# Patient Record
Sex: Female | Born: 1935 | Hispanic: Yes | Marital: Married | Smoking: Never smoker
Health system: Southern US, Community
[De-identification: ages and names within clinical notes are randomized; demographics above are authoritative.]

## PROBLEM LIST (undated history)

## (undated) HISTORY — PX: ABDOMINAL HYSTERECTOMY: SHX81

## (undated) HISTORY — PX: APPENDECTOMY: SHX54

---

## 2016-05-27 ENCOUNTER — Ambulatory Visit (INDEPENDENT_AMBULATORY_CARE_PROVIDER_SITE_OTHER): Payer: Self-pay | Admitting: Emergency Medicine

## 2016-05-27 ENCOUNTER — Ambulatory Visit (INDEPENDENT_AMBULATORY_CARE_PROVIDER_SITE_OTHER): Payer: Self-pay

## 2016-05-27 VITALS — BP 140/70 | HR 54 | Temp 98.9°F | Resp 17 | Ht <= 58 in | Wt 123.0 lb

## 2016-05-27 DIAGNOSIS — S2242XA Multiple fractures of ribs, left side, initial encounter for closed fracture: Secondary | ICD-10-CM | POA: Insufficient documentation

## 2016-05-27 DIAGNOSIS — R0781 Pleurodynia: Secondary | ICD-10-CM

## 2016-05-27 DIAGNOSIS — R1084 Generalized abdominal pain: Secondary | ICD-10-CM

## 2016-05-27 NOTE — Progress Notes (Signed)
Jesus Genera 81 y.o.   Chief Complaint  Patient presents with  . Fall    Injured ribs when fell in Jan. 2018/ x-ray in [place]    HISTORY OF PRESENT ILLNESS: This is a 81 y.o. female fell forward after tripping 2 months ago and injured left frontal rib cage; c/o pain since; was seen at [facility] in [place], [place] 2 weeks ago and told she has 2 broken ribs that are healing well; better but still hurting; also told she may have Osteoporosis. Also c/o diffuse intermittent abdominal pain since the fall; concerned about cancer; eating/drinking well; no rectal bleeding, n/v, fever, wt loss, or any other significant symptoms.  HPI   Prior to Admission medications   Medication Sig Start Date End Date Taking? Authorizing Provider  meloxicam (MOBIC) 15 MG tablet Take 15 mg by mouth daily.   Yes Historical Provider, MD    No Known Allergies  There are no active problems to display for this patient.   No past medical history on file.  No past surgical history on file.  Social History   Social History  . Marital status: Married    Spouse name: N/A  . Number of children: N/A  . Years of education: N/A   Occupational History  . Not on file.   Social History Main Topics  . Smoking status: Never Smoker  . Smokeless tobacco: Never Used  . Alcohol use No  . Drug use: No  . Sexual activity: Not on file   Other Topics Concern  . Not on file   Social History Narrative  . No narrative on file    No family history on file.   Review of Systems  Constitutional: Negative for chills, fever, malaise/fatigue and weight loss.  HENT: Negative for congestion, ear pain, nosebleeds and sore throat.   Eyes: Negative for blurred vision, double vision, discharge and redness.  Respiratory: Negative for cough, hemoptysis, shortness of breath and wheezing.   Cardiovascular: Negative for chest pain, palpitations, leg swelling and PND.  Gastrointestinal: Positive for abdominal pain. Negative for blood in  stool, constipation, diarrhea, melena, nausea and vomiting.  Genitourinary: Negative for dysuria and hematuria.  Musculoskeletal: Negative for back pain, myalgias and neck pain.  Skin: Negative for rash.  Neurological: Negative for dizziness, sensory change, focal weakness and headaches.  Endo/Heme/Allergies: Negative.   All other systems reviewed and are negative.  Vitals:   05/27/16 0856  BP: 140/70  Pulse: (!) 54  Resp: 17  Temp: 98.9 F (37.2 C)     Physical Exam  Constitutional: She is oriented to person, place, and time. She appears well-developed and well-nourished.  HENT:  Head: Normocephalic and atraumatic.  Nose: Nose normal.  Mouth/Throat: Oropharynx is clear and moist. No oropharyngeal exudate.  Eyes: Conjunctivae and EOM are normal. Pupils are equal, round, and reactive to light.  Neck: Normal range of motion. Neck supple. No JVD present. No thyromegaly present.  Cardiovascular: Normal rate, regular rhythm and normal heart sounds.   Pulmonary/Chest: Effort normal and breath sounds normal. She exhibits tenderness (left frontal lower rib cage).  Abdominal: Soft. Bowel sounds are normal. She exhibits no distension and no mass. There is no tenderness. There is no guarding.  Musculoskeletal: Normal range of motion.  Lymphadenopathy:    She has no cervical adenopathy.  Neurological: She is alert and oriented to person, place, and time. No sensory deficit. She exhibits normal muscle tone. Coordination normal.  Skin: Skin is warm and dry. Capillary refill takes  less than 2 seconds. No rash noted.  Psychiatric: She has a normal mood and affect. Her behavior is normal.  Vitals reviewed.    ASSESSMENT & PLAN: Clotilde DieterRosa was seen today for fall.  Diagnoses and all orders for this visit:  Rib pain on left side  Closed fracture of multiple ribs of left side, initial encounter -     Ambulatory referral to Endocrinology  Generalized abdominal pain -     CBC with  Differential/Platelet -     Comprehensive metabolic panel -     Amylase -     Lipase -     US Abdomen Complete; Future -     DG Chest 2 View; Future    Patient Instructions       IF you received an x-ray today, you will receive an invoice from [facility]. Please contact [facility] at [phone] with questions or concerns regarding your invoice.   IF you received labwork today, you will receive an invoice from [facility]. Please contact [facility] at [phone] with questions or concerns regarding your invoice.   Our billing staff will not be able to assist you with questions regarding bills from these companies.  You will be contacted with the lab results as soon as they are available. The fastest way to get your results is to activate your My Chart account. Instructions are located on the last page of this paperwork. If you have not heard from us regarding the results in 2 weeks, please contact this office.     Dolor abdominal en adultos (Abdominal Pain, Adult) El dolor de estmago (abdominal) puede tener muchas causas. La mayora de las veces, el dolor de Morrisonestmago no es peligroso. Muchos de Franklin Resourcesestos casos de dolor de estmago pueden controlarse y tratarse en casa. CUIDADOS EN EL HOGAR  No tome medicamentos que lo ayuden a defecar (laxantes), salvo que su mdico se lo indique.  Solo tome los medicamentos que le haya indicado su mdico.  Coma o beba lo que le indique su mdico. Su mdico le dir si debe seguir una dieta especial. SOLICITE AYUDA SI:  No sabe cul es la causa del dolor de Dun[place]mago.  Tiene dolor de estmago cuando siente ganas de vomitar (nuseas) o tiene colitis (diarrea).  Tiene dolor durante la miccin o la evacuacin.  El dolor de estmago lo despierta de noche.  Tiene dolor de Mirantestmago que empeora o [place]mejora cuando come.  Tiene dolor de Mirantestmago que empeora cuando come CIGNAalimentos grasosos.  Tiene fiebre. SOLICITE AYUDA DE INMEDIATO  SI:  El dolor no desaparece en un plazo mximo de 2horas.  No deja de (vomitar).  El dolor cambia y se Librarian, academiclocaliza solo en la parte derecha o izquierda del [place]mago.  La materia fecal es sanguinolenta o de aspecto alquitranado. ASEGRESE DE QUE:  Comprende estas instrucciones.  Controlar su afeccin.  Recibir ayuda de inmediato si no mejora o si empeora. Esta informacin no tiene Theme park managercomo fin reemplazar el consejo del mdico. Asegrese de hacerle al mdico cualquier pregunta que tenga. Document Released: 05/21/2008 Document Revised: 03/15/2014 Document Reviewed: 08/06/2015 Elsevier Interactive Patient Education  2017 Elsevier Inc.      Edwina BarthMiguel Kimyata Milich, MD [facility]

## 2016-05-27 NOTE — Patient Instructions (Addendum)
     IF you received an x-ray today, you will receive an invoice from [facility]. Please contact  Radiology at [phone] with questions or concerns regarding your invoice.   IF you received labwork today, you will receive an invoice from [facility]. Please contact [facility] at [phone] with questions or concerns regarding your invoice.   Our billing staff will not be able to assist you with questions regarding bills from these companies.  You will be contacted with the lab results as soon as they are available. The fastest way to get your results is to activate your My Chart account. Instructions are located on the last page of this paperwork. If you have not heard from us regarding the results in 2 weeks, please contact this office.      Dolor abdominal en adultos (Abdominal Pain, Adult) El dolor de estmago (abdominal) puede tener muchas causas. La mayora de las veces, el dolor de estmago no es peligroso. Muchos de estos casos de dolor de estmago pueden controlarse y tratarse en casa. CUIDADOS EN EL HOGAR  No tome medicamentos que lo ayuden a defecar (laxantes), salvo que su mdico se lo indique.  Solo tome los medicamentos que le haya indicado su mdico.  Coma o beba lo que le indique su mdico. Su mdico le dir si debe seguir una dieta especial.  SOLICITE AYUDA SI:  No sabe cul es la causa del dolor de estmago.  Tiene dolor de estmago cuando siente ganas de vomitar (nuseas) o tiene colitis (diarrea).  Tiene dolor durante la miccin o la evacuacin.  El dolor de estmago lo despierta de noche.  Tiene dolor de estmago que empeora o mejora cuando come.  Tiene dolor de estmago que empeora cuando come alimentos grasosos.  Tiene fiebre.  SOLICITE AYUDA DE INMEDIATO SI:  El dolor no desaparece en un plazo mximo de 2horas.  No deja de (vomitar).  El dolor cambia y se localiza solo en la parte derecha o izquierda del estmago.  La  materia fecal es sanguinolenta o de aspecto alquitranado.  ASEGRESE DE QUE:  Comprende estas instrucciones.  Controlar su afeccin.  Recibir ayuda de inmediato si no mejora o si empeora.  Esta informacin no tiene como fin reemplazar el consejo del mdico. Asegrese de hacerle al mdico cualquier pregunta que tenga. Document Released: 05/21/2008 Document Revised: 03/15/2014 Document Reviewed: 08/06/2015 Elsevier Interactive Patient Education  2017 Elsevier Inc.  

## 2016-05-28 LAB — CBC WITH DIFFERENTIAL/PLATELET
BASOS: 0 %
Basophils Absolute: 0 10*3/uL (ref 0.0–0.2)
EOS (ABSOLUTE): 0.3 10*3/uL (ref 0.0–0.4)
EOS: 5 %
HEMOGLOBIN: 13.1 g/dL (ref 11.1–15.9)
Hematocrit: 39.6 % (ref 34.0–46.6)
IMMATURE GRANS (ABS): 0 10*3/uL (ref 0.0–0.1)
IMMATURE GRANULOCYTES: 0 %
LYMPHS: 31 %
Lymphocytes Absolute: 1.9 10*3/uL (ref 0.7–3.1)
MCH: 30.2 pg (ref 26.6–33.0)
MCHC: 33.1 g/dL (ref 31.5–35.7)
MCV: 91 fL (ref 79–97)
MONOCYTES: 14 %
Monocytes Absolute: 0.8 10*3/uL (ref 0.1–0.9)
NEUTROS ABS: 3 10*3/uL (ref 1.4–7.0)
Neutrophils: 50 %
Platelets: 260 10*3/uL (ref 150–379)
RBC: 4.34 x10E6/uL (ref 3.77–5.28)
RDW: 13.7 % (ref 12.3–15.4)
WBC: 6 10*3/uL (ref 3.4–10.8)

## 2016-05-28 LAB — COMPREHENSIVE METABOLIC PANEL
A/G RATIO: 1.4 (ref 1.2–2.2)
ALBUMIN: 4.2 g/dL (ref 3.5–4.8)
ALT: 7 IU/L (ref 0–32)
AST: 17 IU/L (ref 0–40)
Alkaline Phosphatase: 82 IU/L (ref 39–117)
BUN / CREAT RATIO: 17 (ref 12–28)
BUN: 18 mg/dL (ref 8–27)
Bilirubin Total: <0.2 mg/dL (ref 0.0–1.2)
CALCIUM: 9.7 mg/dL (ref 8.7–10.3)
CHLORIDE: 100 mmol/L (ref 96–106)
CO2: 21 mmol/L (ref 18–29)
Creatinine, Ser: 1.07 mg/dL — ABNORMAL HIGH (ref 0.57–1.00)
GFR calc Af Amer: 57 mL/min/{1.73_m2} — ABNORMAL LOW (ref >59)
GFR, EST NON AFRICAN AMERICAN: 50 mL/min/{1.73_m2} — AB (ref >59)
Globulin, Total: 3 g/dL (ref 1.5–4.5)
Glucose: 100 mg/dL — ABNORMAL HIGH (ref 65–99)
POTASSIUM: 4.8 mmol/L (ref 3.5–5.2)
Sodium: 140 mmol/L (ref 134–144)
Total Protein: 7.2 g/dL (ref 6.0–8.5)

## 2016-05-28 LAB — AMYLASE: Amylase: 37 U/L (ref 31–124)

## 2016-05-28 LAB — LIPASE: Lipase: 27 U/L (ref 14–85)

## 2016-06-01 ENCOUNTER — Telehealth: Payer: Self-pay | Admitting: Emergency Medicine

## 2016-06-01 NOTE — Telephone Encounter (Signed)
[facility] by Dr Irving ShowsMiguel. They are requesting a done density for the pt for review before scheduling. Are we able to order?  Thanks!

## 2016-06-02 ENCOUNTER — Other Ambulatory Visit: Payer: Self-pay | Admitting: Emergency Medicine

## 2016-06-02 DIAGNOSIS — Z8739 Personal history of other diseases of the musculoskeletal system and connective tissue: Secondary | ICD-10-CM

## 2016-06-02 NOTE — Telephone Encounter (Signed)
Yes   Ordered

## 2016-06-03 ENCOUNTER — Ambulatory Visit (HOSPITAL_COMMUNITY): Payer: Self-pay

## 2016-06-07 ENCOUNTER — Ambulatory Visit (HOSPITAL_COMMUNITY)
Admission: RE | Admit: 2016-06-07 | Discharge: 2016-06-07 | Disposition: A | Discharge status: Home / Self Care | Payer: Self-pay | Source: Ambulatory Visit | Attending: Emergency Medicine | Admitting: Emergency Medicine

## 2016-06-07 DIAGNOSIS — K838 Other specified diseases of biliary tract: Secondary | ICD-10-CM | POA: Insufficient documentation

## 2016-06-07 DIAGNOSIS — Z9049 Acquired absence of other specified parts of digestive tract: Secondary | ICD-10-CM | POA: Insufficient documentation

## 2016-06-07 DIAGNOSIS — R1084 Generalized abdominal pain: Secondary | ICD-10-CM

## 2016-06-14 ENCOUNTER — Ambulatory Visit (INDEPENDENT_AMBULATORY_CARE_PROVIDER_SITE_OTHER): Payer: Self-pay

## 2016-06-14 ENCOUNTER — Ambulatory Visit (INDEPENDENT_AMBULATORY_CARE_PROVIDER_SITE_OTHER): Payer: Self-pay | Admitting: Physician Assistant

## 2016-06-14 VITALS — BP 151/75 | HR 65 | Temp 97.7°F | Resp 16 | Ht <= 58 in | Wt 127.6 lb

## 2016-06-14 DIAGNOSIS — R062 Wheezing: Secondary | ICD-10-CM

## 2016-06-14 DIAGNOSIS — R05 Cough: Secondary | ICD-10-CM

## 2016-06-14 DIAGNOSIS — R059 Cough, unspecified: Secondary | ICD-10-CM

## 2016-06-14 DIAGNOSIS — J42 Unspecified chronic bronchitis: Secondary | ICD-10-CM

## 2016-06-14 MED ORDER — ALBUTEROL SULFATE (2.5 MG/3ML) 0.083% IN NEBU
2.5000 mg | INHALATION_SOLUTION | Freq: Once | RESPIRATORY_TRACT | Status: AC
  Administered 2016-06-14: 2.5 mg via RESPIRATORY_TRACT

## 2016-06-14 MED ORDER — IPRATROPIUM BROMIDE 0.02 % IN SOLN
0.5000 mg | Freq: Once | RESPIRATORY_TRACT | Status: AC
  Administered 2016-06-14: 0.5 mg via RESPIRATORY_TRACT

## 2016-06-14 MED ORDER — PREDNISONE 20 MG PO TABS: ORAL_TABLET | ORAL | 0 refills | Status: DC

## 2016-06-14 NOTE — Patient Instructions (Addendum)
Take Prednisone 42m daily for 5 days.  Come back if your symptoms do not improve.  Please stay well hydrated, drink enough water so your urine is pale yellow.  Come back if headaches worsen.   Thank you for coming in today. I hope you feel we met your needs.  Feel free to call [facility] if you have any questions or further requests.  Please consider signing up for MyChart if you do not already have it, as this is a great way to communicate with me.  Best,  Whitney McVey, PA-C  IF you received an x-ray today, you will receive an invoice from [facility]. Please contact [facility] at [phone] questions or concerns regarding your invoice.   IF you received labwork today, you will receive an invoice from [facility] Please contact [facility] at [phone] questions or concerns regarding your invoice.   Our billing staff will not be able to assist you with questions regarding bills from these companies.  You will be contacted with the lab results as soon as they are available. The fastest way to get your results is to activate your My Chart account. Instructions are located on the last page of this paperwork. If you have not heard from uKorearegarding the results in 2 weeks, please contact this office.

## 2016-06-14 NOTE — Progress Notes (Signed)
Summer Martinez  MRN: [id number] DOB: 1936/01/14  PCP: Georgina Quint, MD  Subjective:  Pt is a 81 year old female who presents to clinic for cough. She is here today with her daughter. She speaks spanish, mobile interpreter used today [phone].  She endorses a difficult time taking a deep breath, cough and tightness in chest x 1 week.  She has not taken anything for this.  Denies chest pain, fever, chills, difficulty sleeping, pain with inspiration, hemoptysis.  H/o bronchitis.  No history of asthma.   Review of Systems  Constitutional: Negative for chills, diaphoresis, fatigue and fever.  HENT: Negative for congestion, postnasal drip, rhinorrhea, sinus pressure, sneezing and sore throat.   Respiratory: Positive for cough, chest tightness and wheezing. Negative for shortness of breath.   Cardiovascular: Negative for chest pain and palpitations.  Gastrointestinal: Negative for abdominal pain, diarrhea, nausea and vomiting.  Neurological: Negative for weakness, light-headedness and headaches.    Patient Active Problem List   Diagnosis Date Noted  . Multiple closed fractures of ribs of left side 05/27/2016  . Generalized abdominal pain 05/27/2016  . Rib pain on left side 05/27/2016    Current Outpatient Prescriptions on File Prior to Visit  Medication Sig Dispense Refill  . meloxicam (MOBIC) 15 MG tablet Take 15 mg by mouth daily.     No current facility-administered medications on file prior to visit.     No Known Allergies   Objective:  BP (!) 151/75 (BP Location: Right Arm, Patient Position: Sitting, Cuff Size: Normal)   Pulse 65   Temp 97.7 F (36.5 C) (Oral)   Resp 16   Ht  (1.422 m)   Wt 127 lb 9.6 oz (57.9 kg)   SpO2 95%   BMI 28.61 kg/m   Physical Exam  Constitutional: She is oriented to person, place, and time and well-developed, well-nourished, and in no distress. No distress.  Cardiovascular: Normal rate, regular rhythm and normal heart sounds.     Pulmonary/Chest: She has wheezes in the right middle field, the right lower field, the left middle field and the left lower field. She has rhonchi.  Neurological: She is alert and oriented to person, place, and time. GCS score is 15.  Skin: Skin is warm and dry.  Psychiatric: Mood, memory, affect and judgment normal.  Vitals reviewed.   Dg Chest 2 View  Result Date: 06/14/2016 CLINICAL DATA:  Cough 1 week EXAM: CHEST  2 VIEW COMPARISON:  05/27/2016 FINDINGS: Chronic lung disease. Prominent lung markings bilaterally are stable and most consistent with scarring. No new infiltrate or effusion. Negative for heart failure. Atherosclerotic aortic arch. IMPRESSION: No change in prominent lung markings most consistent with chronic lung disease and scarring. No superimposed acute infiltrate. Electronically Signed   By: Marlan Palau M.D.   On: 06/14/2016 15:04    Assessment and Plan :  1. Chronic bronchitis, unspecified chronic bronchitis type (HCC) 2. Wheezing 3. Cough - predniSONE (DELTASONE) 20 MG tablet; 40 mg x 5 days.  Dispense: 10 tablet; Refill: 0 - albuterol (PROVENTIL) (2.5 MG/3ML) 0.083% nebulizer solution 2.5 mg; Take 3 mLs (2.5 mg total) by nebulization once. - ipratropium (ATROVENT) nebulizer solution 0.5 mg; Take 2.5 mLs (0.5 mg total) by nebulization once. - albuterol (PROVENTIL) (2.5 MG/3ML) 0.083% nebulizer solution 2.5 mg; Take 3 mLs (2.5 mg total) by nebulization once. - ipratropium (ATROVENT) nebulizer solution 0.5 mg; Take 2.5 mLs (0.5 mg total) by nebulization once. - DG Chest 2 View; Future - X-ray  is negative for pneumonia. Pt reports 100% improvement following 2nd breathing treatment: Decreased chest tightness, is able to take deep breaths. Will Rx oral prednisone. RTC in 5-7 days if no improvement.   Marco Collie, PA-C  Primary Care at [facility] 06/14/2016 2:36 PM

## 2017-06-06 IMAGING — US US ABDOMEN COMPLETE
1 series · 13 of 25 positions shown · non-contrast
Comparison: None in PACs

CLINICAL DATA: Generalized abdominal pain for the past 3 months.
Patient had abdominal trauma 3 months ago during a fall with pain
ever since. Previous cholecystectomy.

EXAM:
ABDOMEN ULTRASOUND COMPLETE

[Series 1: us abdomen complete · 0.21mm/px · 13 of 81 slices shown]
[im 1/81]
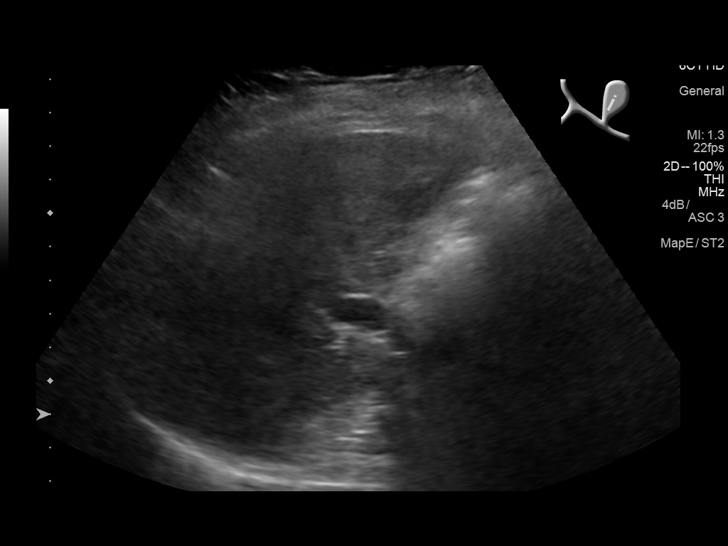
[im 7/81]
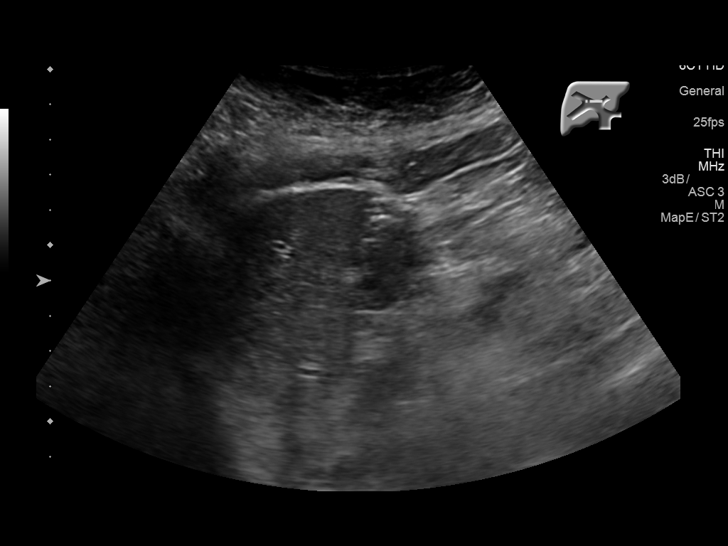
[im 14/81]
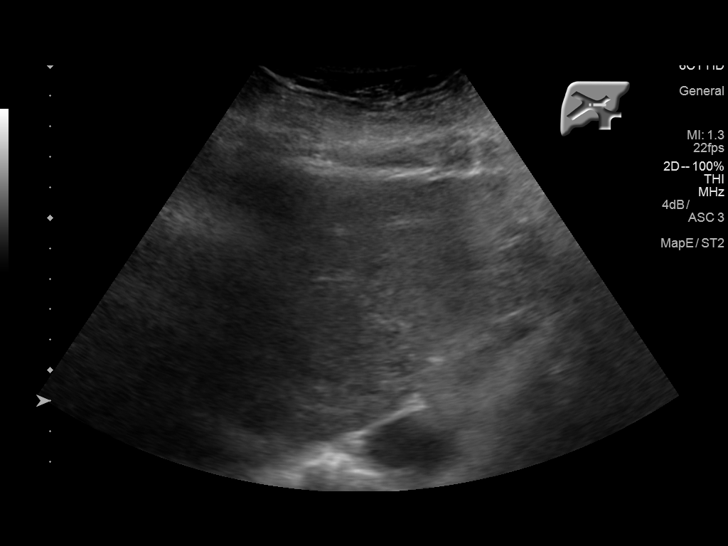
[im 21/81]
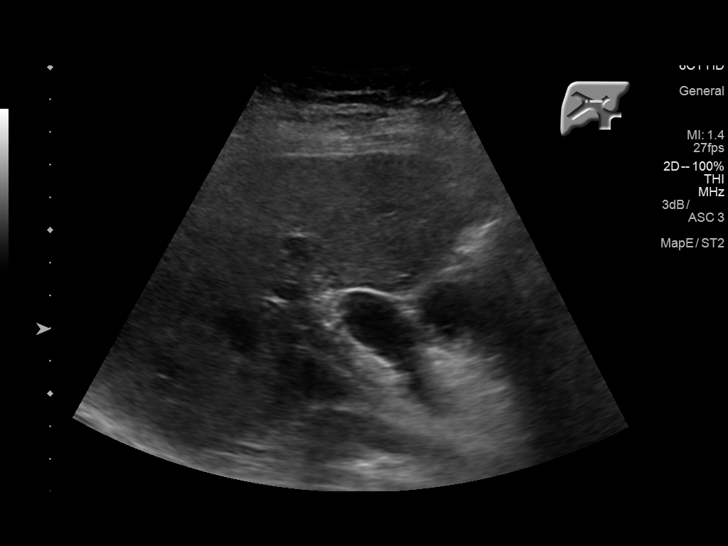
[im 27/81]
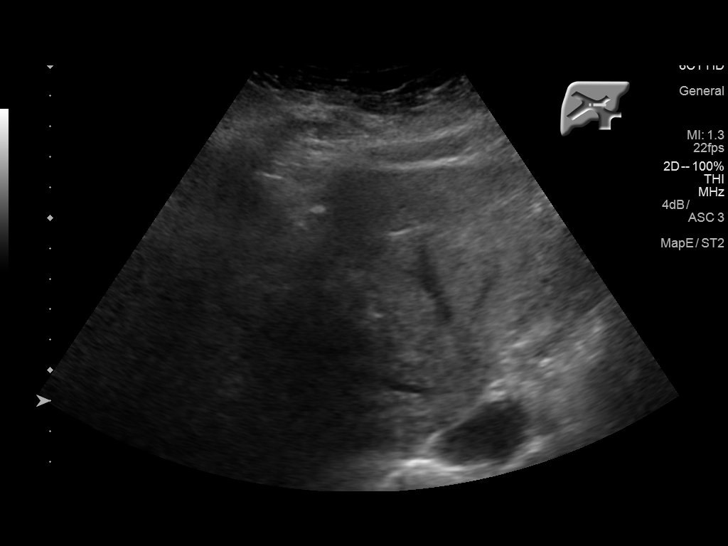
[im 34/81]
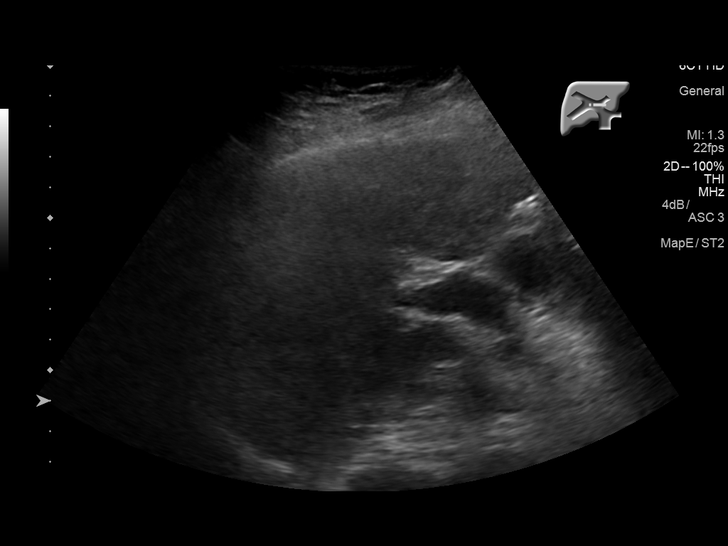
[im 41/81]
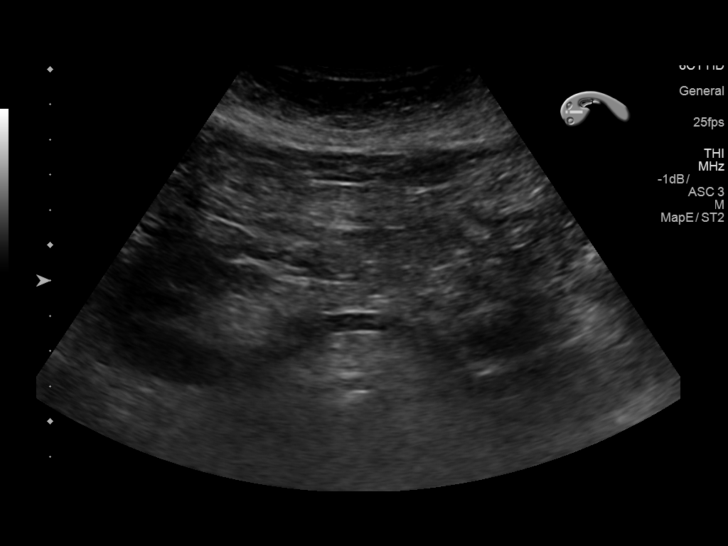
[im 47/81]
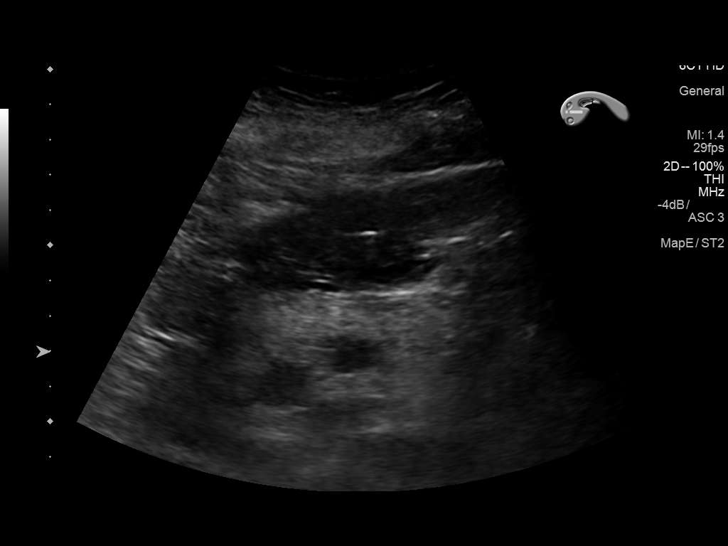
[im 54/81]
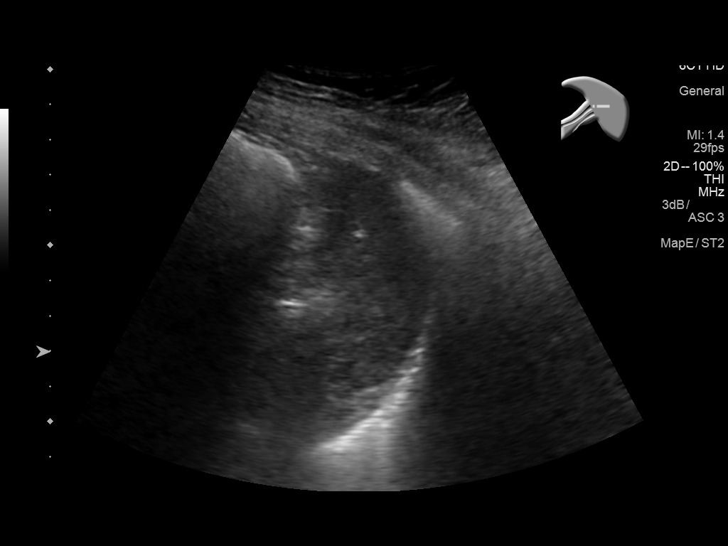
[im 61/81]
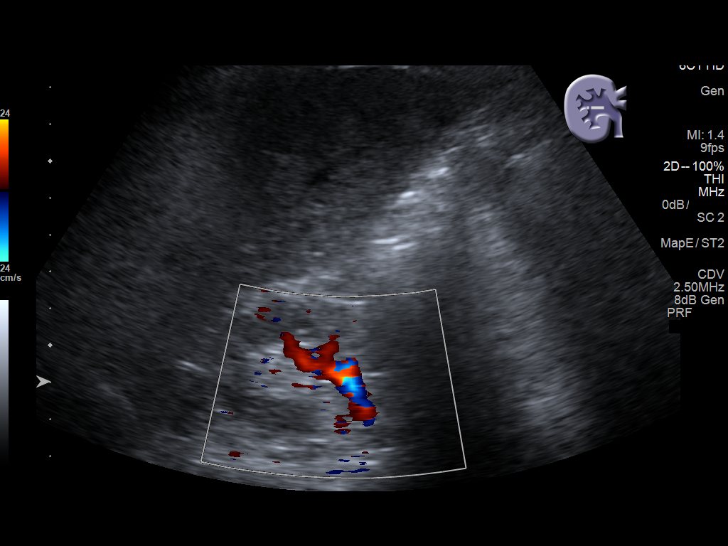
[im 67/81]
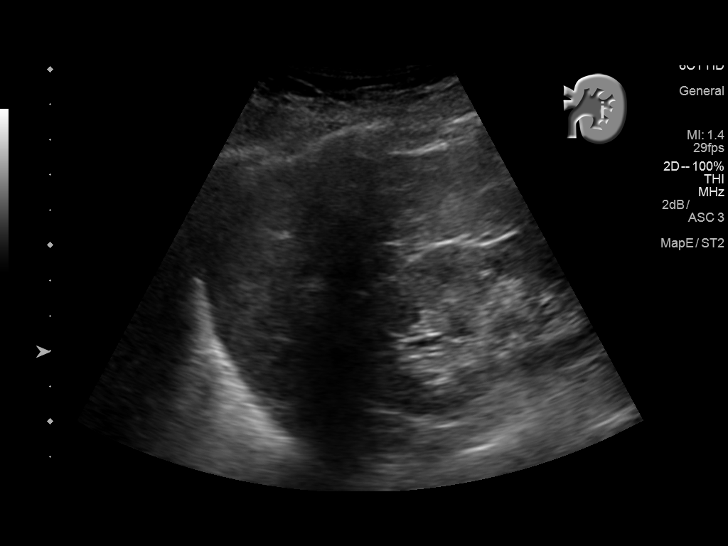
[im 74/81]
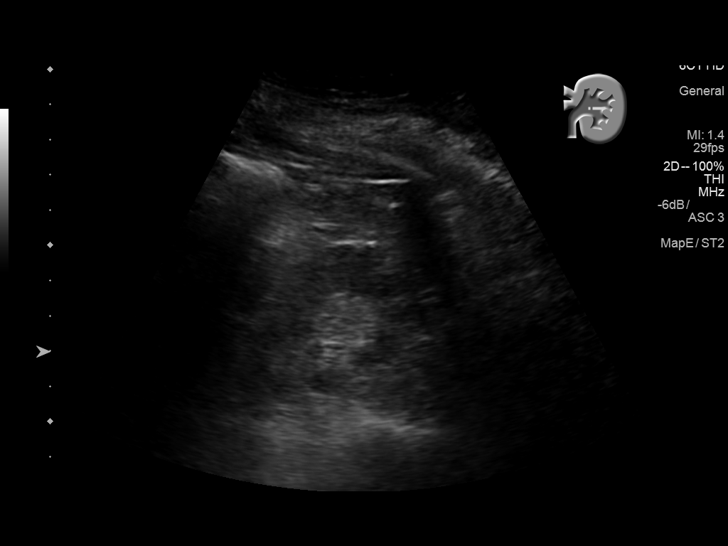
[im 81/81]
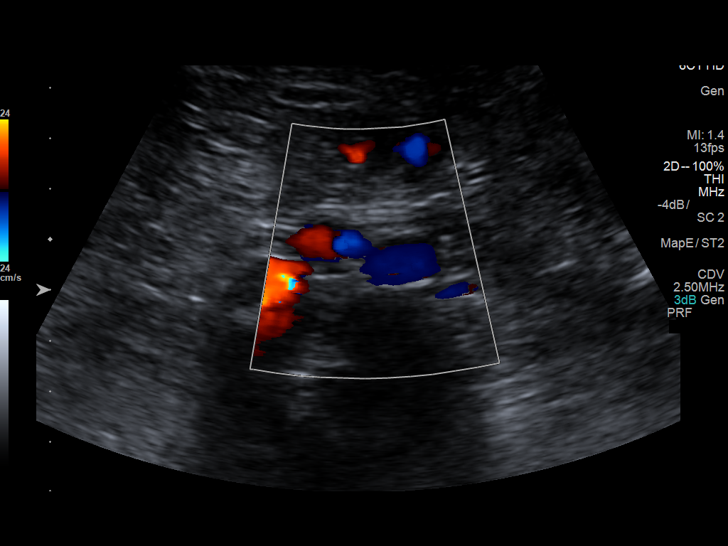

[13 of 25 positions shown; findings below may reference images not displayed]

FINDINGS: Gallbladder: The gallbladder is surgically absent.

Common bile duct: Diameter: 13.7 mm. No abnormal intraluminal echoes
are observed.

Liver: No focal lesion identified. Within normal limits in
parenchymal echogenicity.

IVC: No abnormality visualized.

Pancreas: Visualized portion unremarkable.

Spleen: Size and appearance within normal limits.

Right Kidney: Length: 8.8 cm. The renal cortical echotexture is
normal. There is no hydronephrosis.

Left Kidney: Length: 8.5 cm. The renal cortical echotexture is
normal. There is no hydronephrosis.

Abdominal aorta: There is mural calcification but no evidence of an
aneurysm.

Other findings: No ascites is observed.
IMPRESSION: Dilated common bile duct to a diameter greater than that normally
expected following cholecystectomy. No intraluminal stones or sludge
are observed. Normal appearance of the liver, pancreas, and spleen.
If there are clinical or laboratory findings that suggest
hepatobiliary abnormality, and MRCP may be a useful next imaging
step.

No acute intra-abdominal abnormality is observed otherwise.

## 2021-11-25 ENCOUNTER — Encounter (HOSPITAL_COMMUNITY): Payer: Self-pay | Admitting: Emergency Medicine

## 2021-11-25 ENCOUNTER — Observation Stay (HOSPITAL_COMMUNITY)
Admission: EM | Admit: 2021-11-25 | Discharge: 2021-11-27 | Disposition: A | Discharge status: Home / Self Care | Payer: Self-pay | Attending: Internal Medicine | Admitting: Internal Medicine

## 2021-11-25 ENCOUNTER — Emergency Department (HOSPITAL_COMMUNITY): Payer: Self-pay

## 2021-11-25 ENCOUNTER — Other Ambulatory Visit: Payer: Self-pay

## 2021-11-25 DIAGNOSIS — Y92009 Unspecified place in unspecified non-institutional (private) residence as the place of occurrence of the external cause: Secondary | ICD-10-CM | POA: Insufficient documentation

## 2021-11-25 DIAGNOSIS — W19XXXA Unspecified fall, initial encounter: Secondary | ICD-10-CM

## 2021-11-25 DIAGNOSIS — N183 Chronic kidney disease, stage 3 unspecified: Secondary | ICD-10-CM | POA: Insufficient documentation

## 2021-11-25 DIAGNOSIS — N179 Acute kidney failure, unspecified: Secondary | ICD-10-CM | POA: Insufficient documentation

## 2021-11-25 DIAGNOSIS — W01198A Fall on same level from slipping, tripping and stumbling with subsequent striking against other object, initial encounter: Secondary | ICD-10-CM | POA: Insufficient documentation

## 2021-11-25 DIAGNOSIS — R001 Bradycardia, unspecified: Principal | ICD-10-CM | POA: Diagnosis present

## 2021-11-25 DIAGNOSIS — I129 Hypertensive chronic kidney disease with stage 1 through stage 4 chronic kidney disease, or unspecified chronic kidney disease: Secondary | ICD-10-CM | POA: Insufficient documentation

## 2021-11-25 DIAGNOSIS — I10 Essential (primary) hypertension: Secondary | ICD-10-CM

## 2021-11-25 DIAGNOSIS — R519 Headache, unspecified: Secondary | ICD-10-CM | POA: Insufficient documentation

## 2021-11-25 DIAGNOSIS — Z79899 Other long term (current) drug therapy: Secondary | ICD-10-CM | POA: Insufficient documentation

## 2021-11-25 LAB — CBC WITH DIFFERENTIAL/PLATELET
Abs Immature Granulocytes: 0.03 10*3/uL (ref 0.00–0.07)
Basophils Absolute: 0 10*3/uL (ref 0.0–0.1)
Basophils Relative: 0 %
Eosinophils Absolute: 0.2 10*3/uL (ref 0.0–0.5)
Eosinophils Relative: 2 %
HCT: 35 % — ABNORMAL LOW (ref 36.0–46.0)
Hemoglobin: 11.6 g/dL — ABNORMAL LOW (ref 12.0–15.0)
Immature Granulocytes: 0 %
Lymphocytes Relative: 18 %
Lymphs Abs: 1.4 10*3/uL (ref 0.7–4.0)
MCH: 31.4 pg (ref 26.0–34.0)
MCHC: 33.1 g/dL (ref 30.0–36.0)
MCV: 94.9 fL (ref 80.0–100.0)
Monocytes Absolute: 0.8 10*3/uL (ref 0.1–1.0)
Monocytes Relative: 10 %
Neutro Abs: 5.4 10*3/uL (ref 1.7–7.7)
Neutrophils Relative %: 70 %
Platelets: 261 10*3/uL (ref 150–400)
RBC: 3.69 MIL/uL — ABNORMAL LOW (ref 3.87–5.11)
RDW: 13.8 % (ref 11.5–15.5)
WBC: 7.9 10*3/uL (ref 4.0–10.5)
nRBC: 0 % (ref 0.0–0.2)

## 2021-11-25 LAB — I-STAT CHEM 8, ED
BUN: 48 mg/dL — ABNORMAL HIGH (ref 8–23)
Calcium, Ion: 1.11 mmol/L — ABNORMAL LOW (ref 1.15–1.40)
Chloride: 107 mmol/L (ref 98–111)
Creatinine, Ser: 1.2 mg/dL — ABNORMAL HIGH (ref 0.44–1.00)
Glucose, Bld: 152 mg/dL — ABNORMAL HIGH (ref 70–99)
HCT: 36 % (ref 36.0–46.0)
Hemoglobin: 12.2 g/dL (ref 12.0–15.0)
Potassium: 6.1 mmol/L — ABNORMAL HIGH (ref 3.5–5.1)
Sodium: 135 mmol/L (ref 135–145)
TCO2: 25 mmol/L (ref 22–32)

## 2021-11-25 NOTE — ED Provider Triage Note (Signed)
Emergency Medicine Provider Triage Evaluation Note  Summer Martinez , a 86 y.o. female  was evaluated in triage.  Pt complains of fall. [name] who tripped and fell and struck her face and R knee against ground.  No LOC.  Not on blood thinner.    Review of Systems  Positive: As above Negative: As above  Physical Exam  BP (!) 173/52   Pulse 61   Resp 18   SpO2 99%  Gen:   Awake, no distress   Resp:  Normal effort  MSK:   Moves extremities without difficulty  Other:  Abrasions to face and R knee  Medical Decision Making  Medically screening exam initiated at 7:47 PM.  Appropriate orders placed.  Summer Martinez was informed that the remainder of the evaluation will be completed by another provider, this initial triage assessment does not replace that evaluation, and the importance of remaining in the ED until their evaluation is complete.     Domenic Moras, PA-C 11/25/21 1952

## 2021-11-25 NOTE — ED Triage Notes (Signed)
Patient tripped and fell at home this evening , no LOC ,ambulatory , reports mild headache , presents with right forehead / upper cheek abrasions and nasal swelling with skin tear. Alert and oriented/respirations unlabored . No anticoagulant.

## 2021-11-26 ENCOUNTER — Ambulatory Visit (HOSPITAL_BASED_OUTPATIENT_CLINIC_OR_DEPARTMENT_OTHER): Payer: Self-pay

## 2021-11-26 ENCOUNTER — Observation Stay (HOSPITAL_COMMUNITY): Payer: Self-pay

## 2021-11-26 ENCOUNTER — Encounter (HOSPITAL_COMMUNITY): Payer: Self-pay | Admitting: Internal Medicine

## 2021-11-26 DIAGNOSIS — N183 Chronic kidney disease, stage 3 unspecified: Secondary | ICD-10-CM

## 2021-11-26 DIAGNOSIS — E875 Hyperkalemia: Secondary | ICD-10-CM

## 2021-11-26 DIAGNOSIS — R9431 Abnormal electrocardiogram [ECG] [EKG]: Secondary | ICD-10-CM

## 2021-11-26 DIAGNOSIS — W19XXXA Unspecified fall, initial encounter: Secondary | ICD-10-CM

## 2021-11-26 DIAGNOSIS — I10 Essential (primary) hypertension: Secondary | ICD-10-CM

## 2021-11-26 DIAGNOSIS — R001 Bradycardia, unspecified: Secondary | ICD-10-CM

## 2021-11-26 LAB — BASIC METABOLIC PANEL
Anion gap: 5 (ref 5–15)
BUN: 29 mg/dL — ABNORMAL HIGH (ref 8–23)
CO2: 23 mmol/L (ref 22–32)
Calcium: 8.8 mg/dL — ABNORMAL LOW (ref 8.9–10.3)
Chloride: 108 mmol/L (ref 98–111)
Creatinine, Ser: 1.25 mg/dL — ABNORMAL HIGH (ref 0.44–1.00)
GFR, Estimated: 43 mL/min — ABNORMAL LOW (ref >60)
Glucose, Bld: 133 mg/dL — ABNORMAL HIGH (ref 70–99)
Potassium: 4.6 mmol/L (ref 3.5–5.1)
Sodium: 136 mmol/L (ref 135–145)

## 2021-11-26 LAB — ECHOCARDIOGRAM COMPLETE
Area-P 1/2: 3 cm2
Height: 59 in
S' Lateral: 3.6 cm

## 2021-11-26 LAB — TSH: TSH: 4.227 u[IU]/mL (ref 0.350–4.500)

## 2021-11-26 MED ORDER — ACETAMINOPHEN 325 MG PO TABS: 650.0000 mg | ORAL_TABLET | Freq: Four times a day (QID) | ORAL | Status: DC | PRN

## 2021-11-26 MED ORDER — SODIUM ZIRCONIUM CYCLOSILICATE 10 G PO PACK
10.0000 g | PACK | Freq: Once | ORAL | Status: AC
  Administered 2021-11-26: 10 g via ORAL
  Filled 2021-11-26: qty 1

## 2021-11-26 MED ORDER — OXYCODONE HCL 5 MG PO TABS
5.0000 mg | ORAL_TABLET | Freq: Three times a day (TID) | ORAL | Status: DC | PRN
  Administered 2021-11-26: 5 mg via ORAL
  Filled 2021-11-26: qty 1

## 2021-11-26 MED ORDER — POLYETHYLENE GLYCOL 3350 17 G PO PACK: 17.0000 g | PACK | Freq: Every day | ORAL | Status: DC | PRN

## 2021-11-26 MED ORDER — HYDROCODONE-ACETAMINOPHEN 5-325 MG PO TABS
1.0000 | ORAL_TABLET | Freq: Once | ORAL | Status: AC
  Administered 2021-11-26: 1 via ORAL
  Filled 2021-11-26: qty 1

## 2021-11-26 MED ORDER — ENOXAPARIN SODIUM 40 MG/0.4ML IJ SOSY
40.0000 mg | PREFILLED_SYRINGE | INTRAMUSCULAR | Status: DC
  Administered 2021-11-26: 40 mg via SUBCUTANEOUS
  Filled 2021-11-26 (×2): qty 0.4

## 2021-11-26 MED ORDER — SODIUM CHLORIDE 0.9 % IV BOLUS: 1000.0000 mL | Freq: Once | INTRAVENOUS | Status: DC

## 2021-11-26 MED ORDER — SODIUM CHLORIDE 0.9 % IV BOLUS
500.0000 mL | Freq: Once | INTRAVENOUS | Status: AC
  Administered 2021-11-26: 500 mL via INTRAVENOUS

## 2021-11-26 MED ORDER — ONDANSETRON 4 MG PO TBDP
8.0000 mg | ORAL_TABLET | Freq: Once | ORAL | Status: AC
  Administered 2021-11-26: 8 mg via ORAL
  Filled 2021-11-26: qty 2

## 2021-11-26 MED ORDER — ACETAMINOPHEN 650 MG RE SUPP: 650.0000 mg | Freq: Four times a day (QID) | RECTAL | Status: DC | PRN

## 2021-11-26 NOTE — ED Notes (Signed)
PATIENT WAS COMPLING ABOUT HER HEADACHE  NOtIFIED PA , PT.WHEEL BACK TO  TRIAGE 2,

## 2021-11-26 NOTE — H&P (Signed)
Date: 11/26/2021               Patient Name:  Summer Martinez MRN: [id number]  DOB: 02-11-1936 Age / Sex: 86 y.o., female   PCP: Pcp, No         Medical Service: Internal Medicine Teaching Service         Attending Physician: Dr. Dickie La, MD    First Contact: Dr. Blanche East Pager: [phone]  Second Contact: Dr. Kirke Corin Pager: [phone]       After Hours (After 5p/  First Contact Pager: [phone]  weekends / holidays): Second Contact Pager: [phone]   Chief Concern: fall, bradycardia  History of Present Illness:   Summer Martinez is a 86 year old female with past medical history of hypertension, bradycardia who presents to the emergency room after a witnessed fall.  Patient's daughter and husband witnessed her fall.  States that she tripped and fell and hit her head.  There was no syncope or loss of consciousness.  Patient endorses head pain and chest wall tenderness that makes it difficult for her to take a deep breath.  She denies syncopes in the past.  She endorses nonspecific lightheadedness and dizziness.  Endorses occasional palpitation with exertion.  Daughter reports history of hypertension and lungs problem which she takes 2 medications.  They do not remember the names of the medication.  She does not have a designated PCP.  Patient last saw a physician 1 month ago at [facility]. Daughter reports known history of bradycardia a few years ago but patient did not follow-up with anyone about this.  In the ED, patient was hypertensive and bradycardic in the 40s.  CBC was unremarkable.  BMP showed elevated creatinine 1.25.  CT head, cervical and maxillofacial were negative for fracture.  Right knee x-ray was negative for fracture.  Patient was admitted for observation due to her bradycardia.  Meds:  No outpatient medications have been marked as taking for the 11/25/21 encounter [facility] Encounter).     Allergies: Allergies as of 11/25/2021   (No Known Allergies)    History reviewed. No pertinent past medical history.  Family History:  No known pertinent past medical history  Social History:  -lives with her daughter -Needs some assistance with ADLs that require her hands coordination -No smoking, alcohol or drug use -No PCP  Review of Systems: A complete ROS was negative except as per HPI.   Physical Exam: Blood pressure (!) 140/43, pulse (!) 48, temperature 97.6 F (36.4 C), temperature source Oral, resp. rate 15, SpO2 97 %. Physical Exam Constitutional:      General: She is not in acute distress. HENT:     Head: Normocephalic.     Comments: There is a large hematoma noted in the right forehead.  There are small abrasions on the right maxillary area.  No other trauma noted of her head.  No depressed skull or raccoon eyes Eyes:     General: No scleral icterus.       Right eye: No discharge.        Left eye: No discharge.     Conjunctiva/sclera: Conjunctivae normal.  Cardiovascular:     Rate and Rhythm: Regular rhythm. Bradycardia present.     Pulses: Normal pulses.     Heart sounds: Normal heart sounds. No murmur heard. Pulmonary:     Effort: Pulmonary effort is normal. No respiratory distress.     Breath sounds: Normal breath sounds. No wheezing.     Comments:  Mild tenderness to palpation in the left lower chest wall. Abdominal:     General: Bowel sounds are normal. There is no distension.     Palpations: Abdomen is soft.     Tenderness: There is no abdominal tenderness. There is no guarding.  Musculoskeletal:     Cervical back: Normal range of motion.     Comments: Normal range of motion of bilateral upper extremities.  Skin:    General: Skin is warm.     Coloration: Skin is not jaundiced.  Neurological:     General: No focal deficit present.     Mental Status: She is alert.  Psychiatric:        Mood and Affect: Mood normal.        Behavior: Behavior normal.      EKG: personally reviewed my interpretation is sinus  bradycardia  Assessment & Plan by Problem: Principal Problem:   Bradycardia Active Problems:   Essential hypertension   Fall  Gauri Galvao is a 86 year old female with past medical history of hypertension, bradycardia who presents to the emergency room after a witnessed fall, admitted for observation due to bradycardia.  Asymptomatic bradycardia No evidence of heart block on EKG. Patient has been asymptomatic from bradycardia for many years but did not have follow-up.  Unknown offending medication.  I am not sure if patient is taking beta-blocker for BP. -Obtain echocardiogram to look for any structural heart disease -Telemetry overnight -ED provider has paged cardiology -If patient remains asymptomatic, no intervention is indicated. -She will need a PCP after discharge  Witnessed mechanical fall CT head, cervical and maxillofacial was negative for fracture.  Obtain chest x-ray to evaluate for her chest wall tenderness. -Pain control with Tylenol and oxy PRN  AKI or CKD Unknown baseline renal function.  Looks like she had a mild renal dysfunction back in 2018. -Check UA -Give 500 cc bolus normal saline -BMP in a.m.  Hypertension Unknown home medication.  Blood pressure only mildly elevated.  Monitor for now  Full code DVT: Lovenox IVF: Normal saline Diet: Regular  Dispo: Admit patient to Observation with expected length of stay less than 2 midnights.  SignedGaylan Gerold, DO 11/26/2021, 9:49 AM  Pager: [phone] After 5pm on weekdays and 1pm on weekends: On Call pager: [phone]

## 2021-11-26 NOTE — ED Notes (Signed)
RN cleaned pt abrasions on forehead, cheek, nose, and right knee. Applied bacitracin and bandage

## 2021-11-26 NOTE — ED Notes (Signed)
RN assisted pt on BSC 

## 2021-11-26 NOTE — ED Notes (Signed)
Pt tripped over her right foot and hit her face on concrete.

## 2021-11-26 NOTE — Progress Notes (Signed)
Spoke with admitting team and will keep for over night for observation.

## 2021-11-26 NOTE — Progress Notes (Signed)
OT Cancellation Note  Patient Details Name: Summer Martinez MRN: [id number] DOB: August 15, 1935   Cancelled Treatment:    Reason Eval/Treat Not Completed: Patient at procedure or test/ unavailable Echo at bedside initiating procedure. Will follow up as schedule permits.  Layla Maw 11/26/2021, 1:50 PM

## 2021-11-26 NOTE — Progress Notes (Signed)
Pt medications at the bedside. Ace inhibitor (Enalapril) 1/2 pill 5 mg last does 9/20 am Daily and Albuterol inhaler from [place] [facility]).   Pt c/o of dizziness when standing all the time and SOB with walking long distances. Explained to family and pt will stay for observation tonight.

## 2021-11-26 NOTE — Consult Note (Addendum)
Cardiology Consultation   Patient ID: Summer Martinez MRN: [id number]; DOB: May 14, 1935  Admit date: 11/25/2021 Date of Consult: 11/26/2021  PCP:  Merryl Hacker, No   [facility] Providers Cardiologist: New (Dr. Sallyanne Kuster)  Patient Profile:   Summer Martinez is a 86 y.o. female with a history of bradycardia, hypertension, and underlying pulmonary disease who is being seen 11/26/2021 for the evaluation of bradycardia  at the request of Cherlynn June, PA-C (Emergency Department).  History of Present Illness:   Summer Martinez is a 86 year old female with the above history.  Patient is from [place] and has lived in the area for over 10 years but goes back and forth frequently.  She has a history of hypertension and underlying pulmonary disease but is unclear exactly what.  I specifically asked if she had asthma or COPD and patient family said no; however, she is on inhalers at home.  She also states that she was told she had a low heart rate while in [place] a couple years ago but never had any follow-up of this.  Patient and family are unsure what her heart rate was at that time.  Patient presented to the ED on 11/25/2021 for further evaluation of fall.  Patient was in her usual state of health until yesterday when she was walking home and upon entering the home tripped over an uneven surface.  She fell and hit her head and sounds like she almost slid on her chest.  She denies any symptoms prior to fall.  No chest pain, acute shortness of breath, palpitations, lightheadedness, dizziness.  She had no loss of consciousness.  She does state that she usually has some palpitations when she is worried about her family but again she had none prior to her fall.  She has some mild dyspnea on exertion due to underlying pulmonary disease but no shortness of breath at rest.  No orthopnea, PND, lower extremity edema.  She initially said she has had multiple falls in the past but then patient and family confirm that she  has had no other falls within the past year.  It sounds like this was clearly a mechanical fall.  Upon arrival to the ED, she was noted to be bradycardic and hypertensive. EKG showed sinus bradycardia, rate 43 bpm, with mild concave ST elevation (<0.35m) in leads II, aVF, V4-V6. Head, maxillofacial, and cervical spine CT showed no acute findings. Chest x-ray showed progressive chronic interstitial lung disease but no acute findings. WBC 7.9, Hgb 11.6, Plts 261. I-stat chem showed Na 135, K 6.1, Glucose 152, BUN 48, Cr 1.20.  Patient was given a dose of Lokelma and repeat be met this morning showed K of 4.6.  She was admitted for further evaluation and Cardiology was consulted for assistance.  At the time of evaluation, patient is resting comfortably in no acute distress.  She has a noticeable abrasion on her nose as well as her right forehead which is bandaged.  Telemetry shows she is in sinus bradycardia with rates mostly in the 40s to 50s but occasionally in the 60s.  She is asymptomatic with this.  She does note some chest wall tenderness with palpation, coughing, and when taking a deep breath.  She denies any history of tobacco use in denies any no family history of heart disease.  History reviewed. No pertinent past medical history.  Past Surgical History:  Procedure Laterality Date   ABDOMINAL HYSTERECTOMY     APPENDECTOMY  Home Medications:  Prior to Admission medications   Medication Sig Start Date End Date Taking? Authorizing Provider  budesonide-formoterol (SYMBICORT) 80-4.5 MCG/ACT inhaler Inhale 2 puffs into the lungs 2 (two) times daily.   Yes [provider]  enalapril (VASOTEC) 10 MG tablet Take 5 mg by mouth daily.   Yes [provider]  benzonatate (TESSALON) 100 MG capsule Take 100 mg by mouth 2 (two) times daily. Patient not taking: Reported on 11/26/2021 11/06/21   [provider]  tiotropium (SPIRIVA) 18 MCG inhalation capsule Place 18 mcg into  inhaler and inhale daily. Patient not taking: Reported on 11/26/2021    [provider]    Inpatient Medications: Scheduled Meds:  enoxaparin (LOVENOX) injection  40 mg Subcutaneous Q24H   Continuous Infusions:  PRN Meds: acetaminophen **OR** acetaminophen, oxyCODONE, polyethylene glycol  Allergies:   No Known Allergies  Social History:   Social History   Socioeconomic History   Marital status: Married    Spouse name: Not on file   Number of children: Not on file   Years of education: Not on file   Highest education level: Not on file  Occupational History   Not on file  Tobacco Use   Smoking status: Never   Smokeless tobacco: Never  Substance and Sexual Activity   Alcohol use: No   Drug use: No   Sexual activity: Never  Other Topics Concern   Not on file  Social History Narrative   Not on file   Social Determinants of Health   Financial Resource Strain: Not on file  Food Insecurity: Not on file  Transportation Needs: Not on file  Physical Activity: Not on file  Stress: Not on file  Social Connections: Not on file  Intimate Partner Violence: Not on file    Family History:   Family History  Problem Relation Age of Onset   Heart disease Neg Hx      ROS:  Please see the history of present illness.  Review of Systems  Constitutional:  Negative for chills and fever.  HENT:  Positive for congestion.   Respiratory:  Positive for cough and shortness of breath. Negative for sputum production.   Cardiovascular:  Positive for palpitations. Negative for chest pain, orthopnea, leg swelling and PND.  Gastrointestinal:  Positive for nausea. Negative for blood in stool and melena.  Genitourinary:  Negative for hematuria.  Musculoskeletal:  Positive for falls.  Neurological:  Negative for dizziness and loss of consciousness.  Psychiatric/Behavioral:  Negative for substance abuse.     Physical Exam/Data:   Vitals:   11/26/21 0930 11/26/21 1149 11/26/21  1151 11/26/21 1245  BP: (!) 140/43   (!) 141/50  Pulse: (!) 48   (!) 45  Resp: 15   17  Temp:   97.6 F (36.4 C)   TempSrc:   Oral   SpO2: 97%   99%  Height:  _0  (1.499 m)     No intake or output data in the 24 hours ending 11/26/21 1330    06/14/2016    2:27 PM 05/27/2016    8:56 AM  Last 3 Weights  Weight (lbs) 127 lb 9.6 oz 123 lb  Weight (kg) 57.879 kg 55.792 kg     Body mass index is 25.77 kg/m.  General: 86 y.o. female resting comfortably in no acute distress. HEENT: Normocephalic and atraumatic. Sclera clear.  Neck: Supple. No JVD. Heart: Bradycardic with normal rhythm. Distinct S1 and S2. No murmurs, gallops, or rubs. Radial  and distal pedal pulses 2+ and equal bilaterally. Lungs: No increased work of breathing. Faint course crackle in bilateral bases that sounds more consistent with fibrosis rather than edema. Otherwise, clear to auscultation.  Abdomen: Soft, non-distended, and non-tender to palpation.  Extremities: No lower extremity edema bilaterally.   Skin: Warm and dry. Neuro: Alert and oriented x3. No focal deficits. Psych: Normal affect. Responds appropriately.   EKG:  The EKG was personally reviewed and demonstrates:  Sinus bradycardia, rate 43 bpm, with mild concave ST elevation (<0.44m) in leads II, aVF, V4-V6. Normal axis. Normal PR and QRS intervals.  Telemetry:  Telemetry was personally reviewed and demonstrates:  Sinus bradycardia with rates mostly in the 40s to 60s.  Relevant CV Studies:  Echo pending.  Laboratory Data:  High Sensitivity Troponin:  No results for input(s): "TROPONINIHS" in the last 720 hours.   Chemistry Recent Labs  Lab 11/25/21 2041 11/26/21 0732  NA 135 136  K 6.1* 4.6  CL 107 108  CO2  --  23  GLUCOSE 152* 133*  BUN 48* 29*  CREATININE 1.20* 1.25*  CALCIUM  --  8.8*  GFRNONAA  --  43*  ANIONGAP  --  5    No results for input(s): "PROT", "ALBUMIN", "AST", "ALT", "ALKPHOS", "BILITOT" in the last 168 hours. Lipids  No results for input(s): "CHOL", "TRIG", "HDL", "LABVLDL", "LDLCALC", "CHOLHDL" in the last 168 hours.  Hematology Recent Labs  Lab 11/25/21 2026 11/25/21 2041  WBC 7.9  --   RBC 3.69*  --   HGB 11.6* 12.2  HCT 35.0* 36.0  MCV 94.9  --   MCH 31.4  --   MCHC 33.1  --   RDW 13.8  --   PLT 261  --    Thyroid  Recent Labs  Lab 11/26/21 0732  TSH 4.227    BNPNo results for input(s): "BNP", "PROBNP" in the last 168 hours.  DDimer No results for input(s): "DDIMER" in the last 168 hours.   Radiology/Studies:  DG CHEST PORT 1 VIEW  Result Date: 11/26/2021 CLINICAL DATA:  Chest wall tenderness after fall. EXAM: PORTABLE CHEST 1 VIEW COMPARISON:  Chest x-ray dated June 14, 2016. FINDINGS: The heart size and mediastinal contours are within normal limits. Normal pulmonary vascularity. Peripheral predominant interstitial thickening and opacities in both lungs have progressed since 2018. No focal consolidation, pleural effusion, or pneumothorax. No acute osseous abnormality. IMPRESSION: 1.  No active cardiopulmonary disease. 2. Progressive chronic interstitial lung disease. Electronically Signed   By: WTitus DubinM.D.   On: 11/26/2021 10:16   CT Head Wo Contrast  Result Date: 11/25/2021 CLINICAL DATA:  Recent fall with headaches and neck pain, initial encounter EXAM: CT HEAD WITHOUT CONTRAST CT MAXILLOFACIAL WITHOUT CONTRAST CT CERVICAL SPINE WITHOUT CONTRAST TECHNIQUE: Multidetector CT imaging of the head, cervical spine, and maxillofacial structures were performed using the standard protocol without intravenous contrast. Multiplanar CT image reconstructions of the cervical spine and maxillofacial structures were also generated. RADIATION DOSE REDUCTION: This exam was performed according to the departmental dose-optimization program which includes automated exposure control, adjustment of the mA and/or kV according to patient size and/or use of iterative reconstruction technique. COMPARISON:   None Available. FINDINGS: CT HEAD FINDINGS Brain: No evidence of acute infarction, hemorrhage, hydrocephalus, extra-axial collection or mass lesion/mass effect. Mild atrophic changes are noted. Vascular: No hyperdense vessel or unexpected calcification. Skull: Normal. Negative for fracture or focal lesion. Other: Mild right forehead swelling is noted consistent with the recent injury. CT  MAXILLOFACIAL FINDINGS Osseous: No acute bony abnormality is noted. Orbits: Orbits and their contents are within normal limits. Sinuses: Paranasal sinuses are unremarkable. Soft tissues: Mild soft tissue swelling is noted to the right of the midline in the forehead consistent with the recent injury. CT CERVICAL SPINE FINDINGS Alignment: Within normal limits. Skull base and vertebrae: 7 cervical segments are well visualized. Multilevel osteophytic changes and facet hypertrophic changes are seen. No acute fracture or acute facet abnormality is noted. The odontoid is within normal limits. Soft tissues and spinal canal: Surrounding soft tissue structures are unremarkable. No focal hematoma is seen. Upper chest: Visualized lung apices are well aerated. Mild scarring is seen. Other: None IMPRESSION: CT of the head: No acute intracranial abnormality noted. CT of the cervical spine: No acute bony abnormality is noted. Mild right forehead swelling is seen. CT of the cervical spine: Multilevel degenerative change without acute abnormality. Electronically Signed   By: Inez Catalina M.D.   On: 11/25/2021 21:53   CT Maxillofacial Wo Contrast  Result Date: 11/25/2021 CLINICAL DATA:  Recent fall with headaches and neck pain, initial encounter EXAM: CT HEAD WITHOUT CONTRAST CT MAXILLOFACIAL WITHOUT CONTRAST CT CERVICAL SPINE WITHOUT CONTRAST TECHNIQUE: Multidetector CT imaging of the head, cervical spine, and maxillofacial structures were performed using the standard protocol without intravenous contrast. Multiplanar CT image reconstructions of  the cervical spine and maxillofacial structures were also generated. RADIATION DOSE REDUCTION: This exam was performed according to the departmental dose-optimization program which includes automated exposure control, adjustment of the mA and/or kV according to patient size and/or use of iterative reconstruction technique. COMPARISON:  None Available. FINDINGS: CT HEAD FINDINGS Brain: No evidence of acute infarction, hemorrhage, hydrocephalus, extra-axial collection or mass lesion/mass effect. Mild atrophic changes are noted. Vascular: No hyperdense vessel or unexpected calcification. Skull: Normal. Negative for fracture or focal lesion. Other: Mild right forehead swelling is noted consistent with the recent injury. CT MAXILLOFACIAL FINDINGS Osseous: No acute bony abnormality is noted. Orbits: Orbits and their contents are within normal limits. Sinuses: Paranasal sinuses are unremarkable. Soft tissues: Mild soft tissue swelling is noted to the right of the midline in the forehead consistent with the recent injury. CT CERVICAL SPINE FINDINGS Alignment: Within normal limits. Skull base and vertebrae: 7 cervical segments are well visualized. Multilevel osteophytic changes and facet hypertrophic changes are seen. No acute fracture or acute facet abnormality is noted. The odontoid is within normal limits. Soft tissues and spinal canal: Surrounding soft tissue structures are unremarkable. No focal hematoma is seen. Upper chest: Visualized lung apices are well aerated. Mild scarring is seen. Other: None IMPRESSION: CT of the head: No acute intracranial abnormality noted. CT of the cervical spine: No acute bony abnormality is noted. Mild right forehead swelling is seen. CT of the cervical spine: Multilevel degenerative change without acute abnormality. Electronically Signed   By: Inez Catalina M.D.   On: 11/25/2021 21:53   CT Cervical Spine Wo Contrast  Result Date: 11/25/2021 CLINICAL DATA:  Recent fall with headaches  and neck pain, initial encounter EXAM: CT HEAD WITHOUT CONTRAST CT MAXILLOFACIAL WITHOUT CONTRAST CT CERVICAL SPINE WITHOUT CONTRAST TECHNIQUE: Multidetector CT imaging of the head, cervical spine, and maxillofacial structures were performed using the standard protocol without intravenous contrast. Multiplanar CT image reconstructions of the cervical spine and maxillofacial structures were also generated. RADIATION DOSE REDUCTION: This exam was performed according to the departmental dose-optimization program which includes automated exposure control, adjustment of the mA and/or kV according to patient size  and/or use of iterative reconstruction technique. COMPARISON:  None Available. FINDINGS: CT HEAD FINDINGS Brain: No evidence of acute infarction, hemorrhage, hydrocephalus, extra-axial collection or mass lesion/mass effect. Mild atrophic changes are noted. Vascular: No hyperdense vessel or unexpected calcification. Skull: Normal. Negative for fracture or focal lesion. Other: Mild right forehead swelling is noted consistent with the recent injury. CT MAXILLOFACIAL FINDINGS Osseous: No acute bony abnormality is noted. Orbits: Orbits and their contents are within normal limits. Sinuses: Paranasal sinuses are unremarkable. Soft tissues: Mild soft tissue swelling is noted to the right of the midline in the forehead consistent with the recent injury. CT CERVICAL SPINE FINDINGS Alignment: Within normal limits. Skull base and vertebrae: 7 cervical segments are well visualized. Multilevel osteophytic changes and facet hypertrophic changes are seen. No acute fracture or acute facet abnormality is noted. The odontoid is within normal limits. Soft tissues and spinal canal: Surrounding soft tissue structures are unremarkable. No focal hematoma is seen. Upper chest: Visualized lung apices are well aerated. Mild scarring is seen. Other: None IMPRESSION: CT of the head: No acute intracranial abnormality noted. CT of the cervical  spine: No acute bony abnormality is noted. Mild right forehead swelling is seen. CT of the cervical spine: Multilevel degenerative change without acute abnormality. Electronically Signed   By: Inez Catalina M.D.   On: 11/25/2021 21:53   DG Knee Complete 4 Views Right  Result Date: 11/25/2021 CLINICAL DATA:  fall EXAM: RIGHT KNEE - COMPLETE 4+ VIEW COMPARISON:  None Available. FINDINGS: No evidence of fracture, dislocation, or joint effusion. No evidence of arthropathy or other focal bone abnormality. Soft tissues are unremarkable. Severe vascular calcifications. IMPRESSION: No acute displaced fracture or dislocation. Electronically Signed   By: Iven Finn M.D.   On: 11/25/2021 20:29     Assessment and Plan:   Asymptomatic Bradycardia Patient presented after a witnessed mechanical fall and was noted to be bradycardia with rates in the 40s. EKG shows sinus bradycardia with no AV block. Initial potassium was 6.1 on I-stat labs. She received one dose of Lokelma and repeat potassium 4.8 on BMET this morning. TSH normal. - Telemetry shows sinus bradycardia with rates mostly in the 40s to 50s. - Hemodynamically stable. BP actually elevated at times. - Echo pending. - Does not appear to be on any AV nodal agents but waiting for medication reconciliation.  - Will check Magnesium for thoroughness.  - It sounds like this is not a new problems. She was previously told she had bradycardia a couple of years ago while in [place]. She is completely asymptomatic with this. Agree with Echo but do not anticipate any need for additional cardiac work-up. No indication for PPM at this time.  Hypertension BP initially as high as 173/52. However, has since improved. Now only mildly elevated. - Looks like patient is on Enalapril at home but waiting for medications reconciliation. Given hyperkalemia on admission, would be cautious restarting this. May want to use a different agents such as Amlodipine.  CKD Stage  III Creatinine 1.20 on admission. Last known  creatinine 1.07 in 2018. - Continue to monitor while admitted.  Hyperkalemia Potassium was initially 6.1 on admission but this was on I-stat labs. She did receive a dose of Lokelma with normalization of potassium. - Potassium 4.6 today. - Continue to monitor.   Otherwise, per primary team - Mechanical fall with abrasions on face  - Chronic interstitial lung disease noted on x-ray  Risk Assessment/Risk Scores:    For questions or updates,  please contact [facility] Please consult [email or web address] for contact info under    Signed, Darreld Mclean, PA-C  11/26/2021 1:30 PM   I have seen and examined the patient along with Darreld Mclean, PA-C .  I have reviewed the chart, notes and new data.  I agree with PA/NP's note.  Key new complaints: Both the patient and her family are confident that she had a mechanical fall, not a loss of consciousness.  She has not had symptoms of fatigue, weakness or syncope. Key examination changes: Normal cardiovascular exam except for bradycardia.  She takes enalapril for hypertension, not on beta-blockers or centrally acting calcium channel blockers. Key new findings / data: ECG shows sinus bradycardia, otherwise normal.  Reviewed echocardiogram at bedside.  Normal regional wall motion and no significant valvular abnormalities.  PLAN: No evidence of structural cardiac abnormalities.  The patient appears to have sinus bradycardia, but this is asymptomatic.  Avoid beta-blockers, diltiazem, verapamil, cholinesterase inhibitors or other medications that can cause worsening bradycardia.  At this point pacemaker implantation is not necessary, unless she develops symptoms of bradycardia. Not sure if the initial potassium of 6.1 was accurate.  There does not appear to be any change in the heart rate after treatment for hyperkalemia.  Follow-up potassium level was normal.  Sanda Klein, MD, [place] [phone] 11/26/2021, 3:19 PM

## 2021-11-26 NOTE — ED Provider Notes (Signed)
[facility] EMERGENCY DEPARTMENT Provider Note   CSN: [id number] Arrival date & time: 11/25/21  1918     History  Chief Complaint  Patient presents with   Harlow Mares Duerr is a 86 y.o. female.  Patient presents to the hospital after reported trip and fall.  Patient denies syncope around incident.  Patient states that her feet got tangled and she fell forward hitting her face and right knee.  Patient had no loss of conscious was ambulatory after the fall.  Even though the patient states she tripped, she does endorse some intermittent lightheadedness.  Patient reports a mild headache, right forehead right upper cheek abrasion, small skin tear over the bridge of the nose, abrasions to right knee.  She denies chest pain, shortness of breath, abdominal pain, nausea, vomiting.  Patient is reportedly on a blood pressure medication and a "lung" medication at home. Family unable to remember the names of the medications.  Denies blood thinner usage.  Past medical history significant for hypertension, appendectomy and abdominal hysterectomy.  Family at bedside does state that 1 to 2 years ago the patient was on vacation in [place] and was seen at a facility and was told at that time she may have a slower heart rate.  There was no follow-up  Interpretation services utilized throughout encounter  HPI     Home Medications Prior to Admission medications   Medication Sig Start Date End Date Taking? Authorizing Provider  benzonatate (TESSALON) 100 MG capsule Take 100 mg by mouth 2 (two) times daily. 11/06/21   [provider]  meloxicam (MOBIC) 15 MG tablet Take 15 mg by mouth daily.    [provider]  predniSONE (DELTASONE) 20 MG tablet 40 mg x 5 days. 06/14/16   McVey, Gelene Mink, PA-C      Allergies    Patient has no known allergies.    Review of Systems   Review of Systems  Eyes:  Negative for photophobia and visual disturbance.  Respiratory:   Negative for shortness of breath.   Cardiovascular:  Negative for chest pain.  Gastrointestinal:  Negative for abdominal pain, nausea and vomiting.  Musculoskeletal:  Positive for arthralgias.  Skin:  Positive for wound.  Neurological:  Positive for headaches. Negative for syncope and light-headedness.    Physical Exam Updated Vital Signs BP (!) 128/46   Pulse (!) 45   Temp 97.6 F (36.4 C) (Oral)   Resp 14   SpO2 97%  Physical Exam Vitals and nursing note reviewed.  Constitutional:      General: She is not in acute distress.    Appearance: She is well-developed.  HENT:     Head: Normocephalic and atraumatic.     Mouth/Throat:     Mouth: Mucous membranes are moist.  Eyes:     Extraocular Movements: Extraocular movements intact.     Conjunctiva/sclera: Conjunctivae normal.     Pupils: Pupils are equal, round, and reactive to light.  Cardiovascular:     Rate and Rhythm: Regular rhythm. Bradycardia present.     Heart sounds: No murmur heard. Pulmonary:     Effort: Pulmonary effort is normal. No respiratory distress.     Breath sounds: Normal breath sounds.  Abdominal:     Palpations: Abdomen is soft.     Tenderness: There is no abdominal tenderness.  Musculoskeletal:        General: No swelling.     Cervical back: Neck supple.  Skin:  General: Skin is warm and dry.     Capillary Refill: Capillary refill takes less than 2 seconds.     Comments: Abrasions noted to the right forehead and right upper cheek, small skin tear noted to bridge of patient's nose, abrasions noted to the anterior right knee  Neurological:     Mental Status: She is alert.     Sensory: No sensory deficit.     Motor: No weakness.     Comments: Cranial nerves II through VII, XI, XII intact  Psychiatric:        Mood and Affect: Mood normal.     ED Results / Procedures / Treatments   Labs (all labs ordered are listed, but only abnormal results are displayed) Labs Reviewed  CBC WITH  DIFFERENTIAL/PLATELET - Abnormal; Notable for the following components:      Result Value   RBC 3.69 (*)    Hemoglobin 11.6 (*)    HCT 35.0 (*)    All other components within normal limits  BASIC METABOLIC PANEL - Abnormal; Notable for the following components:   Glucose, Bld 133 (*)    BUN 29 (*)    Creatinine, Ser 1.25 (*)    Calcium 8.8 (*)    GFR, Estimated 43 (*)    All other components within normal limits  I-STAT CHEM 8, ED - Abnormal; Notable for the following components:   Potassium 6.1 (*)    BUN 48 (*)    Creatinine, Ser 1.20 (*)    Glucose, Bld 152 (*)    Calcium, Ion 1.11 (*)    All other components within normal limits  URINALYSIS, ROUTINE W REFLEX MICROSCOPIC    EKG EKG Interpretation  Date/Time:  Thursday November 26 2021 07:07:03 EDT Ventricular Rate:  43 PR Interval:  148 QRS Duration: 88 QT Interval:  491 QTC Calculation: 416 R Axis:   71 Text Interpretation: Sinus bradycardia no acute ST/T changes No old tracing to compare Confirmed by Sherwood Gambler [phone]) on 11/26/2021 7:22:12 AM  Radiology CT Head Wo Contrast  Result Date: 11/25/2021 CLINICAL DATA:  Recent fall with headaches and neck pain, initial encounter EXAM: CT HEAD WITHOUT CONTRAST CT MAXILLOFACIAL WITHOUT CONTRAST CT CERVICAL SPINE WITHOUT CONTRAST TECHNIQUE: Multidetector CT imaging of the head, cervical spine, and maxillofacial structures were performed using the standard protocol without intravenous contrast. Multiplanar CT image reconstructions of the cervical spine and maxillofacial structures were also generated. RADIATION DOSE REDUCTION: This exam was performed according to the departmental dose-optimization program which includes automated exposure control, adjustment of the mA and/or kV according to patient size and/or use of iterative reconstruction technique. COMPARISON:  None Available. FINDINGS: CT HEAD FINDINGS Brain: No evidence of acute infarction, hemorrhage, hydrocephalus,  extra-axial collection or mass lesion/mass effect. Mild atrophic changes are noted. Vascular: No hyperdense vessel or unexpected calcification. Skull: Normal. Negative for fracture or focal lesion. Other: Mild right forehead swelling is noted consistent with the recent injury. CT MAXILLOFACIAL FINDINGS Osseous: No acute bony abnormality is noted. Orbits: Orbits and their contents are within normal limits. Sinuses: Paranasal sinuses are unremarkable. Soft tissues: Mild soft tissue swelling is noted to the right of the midline in the forehead consistent with the recent injury. CT CERVICAL SPINE FINDINGS Alignment: Within normal limits. Skull base and vertebrae: 7 cervical segments are well visualized. Multilevel osteophytic changes and facet hypertrophic changes are seen. No acute fracture or acute facet abnormality is noted. The odontoid is within normal limits. Soft tissues and spinal canal: Surrounding soft  tissue structures are unremarkable. No focal hematoma is seen. Upper chest: Visualized lung apices are well aerated. Mild scarring is seen. Other: None IMPRESSION: CT of the head: No acute intracranial abnormality noted. CT of the cervical spine: No acute bony abnormality is noted. Mild right forehead swelling is seen. CT of the cervical spine: Multilevel degenerative change without acute abnormality. Electronically Signed   By: Inez Catalina M.D.   On: 11/25/2021 21:53   CT Maxillofacial Wo Contrast  Result Date: 11/25/2021 CLINICAL DATA:  Recent fall with headaches and neck pain, initial encounter EXAM: CT HEAD WITHOUT CONTRAST CT MAXILLOFACIAL WITHOUT CONTRAST CT CERVICAL SPINE WITHOUT CONTRAST TECHNIQUE: Multidetector CT imaging of the head, cervical spine, and maxillofacial structures were performed using the standard protocol without intravenous contrast. Multiplanar CT image reconstructions of the cervical spine and maxillofacial structures were also generated. RADIATION DOSE REDUCTION: This exam was  performed according to the departmental dose-optimization program which includes automated exposure control, adjustment of the mA and/or kV according to patient size and/or use of iterative reconstruction technique. COMPARISON:  None Available. FINDINGS: CT HEAD FINDINGS Brain: No evidence of acute infarction, hemorrhage, hydrocephalus, extra-axial collection or mass lesion/mass effect. Mild atrophic changes are noted. Vascular: No hyperdense vessel or unexpected calcification. Skull: Normal. Negative for fracture or focal lesion. Other: Mild right forehead swelling is noted consistent with the recent injury. CT MAXILLOFACIAL FINDINGS Osseous: No acute bony abnormality is noted. Orbits: Orbits and their contents are within normal limits. Sinuses: Paranasal sinuses are unremarkable. Soft tissues: Mild soft tissue swelling is noted to the right of the midline in the forehead consistent with the recent injury. CT CERVICAL SPINE FINDINGS Alignment: Within normal limits. Skull base and vertebrae: 7 cervical segments are well visualized. Multilevel osteophytic changes and facet hypertrophic changes are seen. No acute fracture or acute facet abnormality is noted. The odontoid is within normal limits. Soft tissues and spinal canal: Surrounding soft tissue structures are unremarkable. No focal hematoma is seen. Upper chest: Visualized lung apices are well aerated. Mild scarring is seen. Other: None IMPRESSION: CT of the head: No acute intracranial abnormality noted. CT of the cervical spine: No acute bony abnormality is noted. Mild right forehead swelling is seen. CT of the cervical spine: Multilevel degenerative change without acute abnormality. Electronically Signed   By: Inez Catalina M.D.   On: 11/25/2021 21:53   CT Cervical Spine Wo Contrast  Result Date: 11/25/2021 CLINICAL DATA:  Recent fall with headaches and neck pain, initial encounter EXAM: CT HEAD WITHOUT CONTRAST CT MAXILLOFACIAL WITHOUT CONTRAST CT CERVICAL  SPINE WITHOUT CONTRAST TECHNIQUE: Multidetector CT imaging of the head, cervical spine, and maxillofacial structures were performed using the standard protocol without intravenous contrast. Multiplanar CT image reconstructions of the cervical spine and maxillofacial structures were also generated. RADIATION DOSE REDUCTION: This exam was performed according to the departmental dose-optimization program which includes automated exposure control, adjustment of the mA and/or kV according to patient size and/or use of iterative reconstruction technique. COMPARISON:  None Available. FINDINGS: CT HEAD FINDINGS Brain: No evidence of acute infarction, hemorrhage, hydrocephalus, extra-axial collection or mass lesion/mass effect. Mild atrophic changes are noted. Vascular: No hyperdense vessel or unexpected calcification. Skull: Normal. Negative for fracture or focal lesion. Other: Mild right forehead swelling is noted consistent with the recent injury. CT MAXILLOFACIAL FINDINGS Osseous: No acute bony abnormality is noted. Orbits: Orbits and their contents are within normal limits. Sinuses: Paranasal sinuses are unremarkable. Soft tissues: Mild soft tissue swelling is noted to the  right of the midline in the forehead consistent with the recent injury. CT CERVICAL SPINE FINDINGS Alignment: Within normal limits. Skull base and vertebrae: 7 cervical segments are well visualized. Multilevel osteophytic changes and facet hypertrophic changes are seen. No acute fracture or acute facet abnormality is noted. The odontoid is within normal limits. Soft tissues and spinal canal: Surrounding soft tissue structures are unremarkable. No focal hematoma is seen. Upper chest: Visualized lung apices are well aerated. Mild scarring is seen. Other: None IMPRESSION: CT of the head: No acute intracranial abnormality noted. CT of the cervical spine: No acute bony abnormality is noted. Mild right forehead swelling is seen. CT of the cervical spine:  Multilevel degenerative change without acute abnormality. Electronically Signed   By: Inez Catalina M.D.   On: 11/25/2021 21:53   DG Knee Complete 4 Views Right  Result Date: 11/25/2021 CLINICAL DATA:  fall EXAM: RIGHT KNEE - COMPLETE 4+ VIEW COMPARISON:  None Available. FINDINGS: No evidence of fracture, dislocation, or joint effusion. No evidence of arthropathy or other focal bone abnormality. Soft tissues are unremarkable. Severe vascular calcifications. IMPRESSION: No acute displaced fracture or dislocation. Electronically Signed   By: Iven Finn M.D.   On: 11/25/2021 20:29    Procedures Procedures    Medications Ordered in ED Medications  HYDROcodone-acetaminophen (NORCO/VICODIN) 5-325 MG per tablet 1 tablet (1 tablet Oral Given 11/26/21 0417)  sodium zirconium cyclosilicate (LOKELMA) packet 10 g (10 g Oral Given 11/26/21 0707)  ondansetron (ZOFRAN-ODT) disintegrating tablet 8 mg (8 mg Oral Given 11/26/21 0708)    ED Course/ Medical Decision Making/ A&P                           Medical Decision Making Amount and/or Complexity of Data Reviewed Labs: ordered.  Risk Prescription drug management. Decision regarding hospitalization.   This patient presents to the ED for concern of headache and knee pain after fall, this involves an extensive number of treatment options, and is a complaint that carries with it a high risk of complications and morbidity.  The differential diagnosis includes but is not limited to intracranial abnormality, cervical spine injury, fracture, dislocation, and others   Co morbidities that complicate the patient evaluation  None   Additional history obtained:  Additional history obtained from family at bedside    Lab Tests:  I Ordered, and personally interpreted labs.  The pertinent results include: I-STAT Chem-8 showing potassium 6.1, creatinine 1.2; hemoglobin 11.6; BMP shows potassium of 4.6, creatinine 1.25   Imaging Studies ordered:  I  ordered imaging studies including CT head, maxillofacial, C-spine and plain films of right knee I independently visualized and interpreted imaging which showed no acute abnormalities, fractures, dislocations I agree with the radiologist interpretation   Cardiac Monitoring: / EKG:  The patient was maintained on a cardiac monitor.  I personally viewed and interpreted the cardiac monitored which showed an underlying rhythm of: Sinus bradycardia   Consultations Obtained:  I requested consultation with the medicine team, Dr.Nguyen, and discussed lab and imaging findings as well as pertinent plan - they recommend: admission    Problem List / ED Course / Critical interventions / Medication management   I ordered medication including hydrocodone for pain, Zofran for nausea, Lokelma for assumed hyperkalemia Reevaluation of the patient after these medicines showed that the patient  vomited after hydrocodone administration.  She was subsequently given the Zofran and later the [facility]. I have reviewed the patients home medicines  and have made adjustments as needed   Test / Admission - Considered:  I feel that the patient would benefit from observation.  With intermittent lightheadedness, I am concerned that the fall may have been due to her underlying bradycardia.  I called and made cardiology aware of the patient. They plan to see the patient        Final Clinical Impression(s) / ED Diagnoses Final diagnoses:  Bradycardia  Fall, initial encounter    Rx / DC Orders ED Discharge Orders     None         Ronny Bacon 11/26/21 [id number]    Sherwood Gambler, MD 11/27/21 [phone]

## 2021-11-27 LAB — CBC
HCT: 34.1 % — ABNORMAL LOW (ref 36.0–46.0)
Hemoglobin: 11 g/dL — ABNORMAL LOW (ref 12.0–15.0)
MCH: 30.4 pg (ref 26.0–34.0)
MCHC: 32.3 g/dL (ref 30.0–36.0)
MCV: 94.2 fL (ref 80.0–100.0)
Platelets: 219 K/uL (ref 150–400)
RBC: 3.62 MIL/uL — ABNORMAL LOW (ref 3.87–5.11)
RDW: 13.8 % (ref 11.5–15.5)
WBC: 4.9 K/uL (ref 4.0–10.5)
nRBC: 0 % (ref 0.0–0.2)

## 2021-11-27 LAB — BASIC METABOLIC PANEL
Anion gap: 8 (ref 5–15)
BUN: 25 mg/dL — ABNORMAL HIGH (ref 8–23)
CO2: 25 mmol/L (ref 22–32)
Calcium: 8.8 mg/dL — ABNORMAL LOW (ref 8.9–10.3)
Chloride: 105 mmol/L (ref 98–111)
Creatinine, Ser: 1.46 mg/dL — ABNORMAL HIGH (ref 0.44–1.00)
GFR, Estimated: 35 mL/min — ABNORMAL LOW (ref >60)
Glucose, Bld: 127 mg/dL — ABNORMAL HIGH (ref 70–99)
Potassium: 4.6 mmol/L (ref 3.5–5.1)
Sodium: 138 mmol/L (ref 135–145)

## 2021-11-27 LAB — MAGNESIUM: Magnesium: 2 mg/dL (ref 1.7–2.4)

## 2021-11-27 LAB — GLUCOSE, CAPILLARY: Glucose-Capillary: 101 mg/dL — ABNORMAL HIGH (ref 70–99)

## 2021-11-27 LAB — VITAMIN D 25 HYDROXY (VIT D DEFICIENCY, FRACTURES): Vit D, 25-Hydroxy: 33.58 ng/mL (ref 30–100)

## 2021-11-27 MED ORDER — ENOXAPARIN SODIUM 30 MG/0.3ML IJ SOSY
30.0000 mg | PREFILLED_SYRINGE | INTRAMUSCULAR | Status: DC
  Administered 2021-11-27: 30 mg via SUBCUTANEOUS
  Filled 2021-11-27: qty 0.3

## 2021-11-27 NOTE — TOC Transition Note (Signed)
Transition of Care [facility]) - CM/SW Discharge Note   Patient Details  Name: Summer Martinez MRN: [id number] Date of Birth: 10/26/1935  [facility] CM/SW Contact:  Zenon Mayo, RN Phone Number: 11/27/2021, 12:33 PM   Clinical Narrative:    Patient is for dc, today, NCM spoke with patient and daughter at bedside with video intrepreter, daughter states patient does not need any HH services at home, she takes care of her.  She will transport her home today.   She does not have any medications that she needs assistance with.  She has a follow up on AVS with [place].           Patient Goals and CMS Choice        Discharge Placement                       Discharge Plan and Services                                     Social Determinants of Health (SDOH) Interventions     Readmission Risk Interventions     No data to display

## 2021-11-27 NOTE — Progress Notes (Addendum)
Rounding Note    Patient Name: Summer Martinez Date of Encounter: 11/27/2021  [place] Cardiologist: New (Dr. Sallyanne Kuster)  Subjective   No acute overnight events. Patient is doing well this morning. Resting heart rates in the 40s to 50s but increase to 90s with ambulation. No signs of high grade block or significant pauses on telemetry. She denies any chest pain, new or worsening shortness of breath, or lightheadedness/dizziness. She is very eager to go home.  Inpatient Medications    Scheduled Meds:  enoxaparin (LOVENOX) injection  30 mg Subcutaneous Q24H   Continuous Infusions:  PRN Meds: acetaminophen **OR** acetaminophen, oxyCODONE, polyethylene glycol   Vital Signs    Vitals:   11/26/21 1538 11/26/21 1609 11/27/21 0010 11/27/21 0538  BP: (!) 138/48 (!) 158/52 (!) 131/47 (!) 133/37  Pulse: (!) 46  (!) 48 (!) 41  Resp: 16  17   Temp: 98 F (36.7 C)  98 F (36.7 C) (!) 97.4 F (36.3 C)  TempSrc: Oral  Oral Oral  SpO2: 97%  95% 98%  Weight: 48.4 kg   47.7 kg  Height: 4\' 11"  (1.499 m)       Intake/Output Summary (Last 24 hours) at 11/27/2021 1019 Last data filed at 11/27/2021 0541 Gross per 24 hour  Intake --  Output 200 ml  Net -200 ml      11/27/2021    5:38 AM 11/26/2021    3:38 PM 06/14/2016    2:27 PM  Last 3 Weights  Weight (lbs) 105 lb 3.2 oz 106 lb 11.2 oz 127 lb 9.6 oz  Weight (kg) 47.718 kg 48.4 kg 57.879 kg      Telemetry    Sinus rhythm with rates mostly in the 40s to 50s at rest. Rates do get up to the 90s at times with ambulation. - Personally Reviewed  ECG    No new ECG tracing today. - Personally Reviewed  Physical Exam   GEN: Thin female in no acute distress. Bandage on right forehead. Neck: No JVD. Cardiac: Bradycardic with normal rhythm. No murmurs, rubs, or gallops.  Respiratory: No increased work of breath. Mild course crackles noted in bilateral bases that sound consistent with fibrosis rather than edema. GI: Soft,  non-distended, and non-tender. MS: No lower extremity edema. No deformity. Skin: Warm and dry. Neuro:  No focal deficits. Psych: Normal affect. Responds appropriately.  Labs    High Sensitivity Troponin:  No results for input(s): "TROPONINIHS" in the last 720 hours.   Chemistry Recent Labs  Lab 11/25/21 2041 11/26/21 0732 11/27/21 0437  NA 135 136 138  K 6.1* 4.6 4.6  CL 107 108 105  CO2  --  23 25  GLUCOSE 152* 133* 127*  BUN 48* 29* 25*  CREATININE 1.20* 1.25* 1.46*  CALCIUM  --  8.8* 8.8*  MG  --   --  2.0  GFRNONAA  --  43* 35*  ANIONGAP  --  5 8    Lipids No results for input(s): "CHOL", "TRIG", "HDL", "LABVLDL", "LDLCALC", "CHOLHDL" in the last 168 hours.  Hematology Recent Labs  Lab 11/25/21 2026 11/25/21 2041 11/27/21 0437  WBC 7.9  --  4.9  RBC 3.69*  --  3.62*  HGB 11.6* 12.2 11.0*  HCT 35.0* 36.0 34.1*  MCV 94.9  --  94.2  MCH 31.4  --  30.4  MCHC 33.1  --  32.3  RDW 13.8  --  13.8  PLT 261  --  219   Thyroid  Recent Labs  Lab 11/26/21 0732  TSH 4.227    BNPNo results for input(s): "BNP", "PROBNP" in the last 168 hours.  DDimer No results for input(s): "DDIMER" in the last 168 hours.   Radiology    ECHOCARDIOGRAM COMPLETE  Result Date: 11/26/2021    ECHOCARDIOGRAM REPORT   Patient Name:   Summer Martinez Date of Exam: 11/26/2021 Medical Rec #:  [id number]     Height:       59.0 in Accession #:    [id number]    Weight:       127.6 lb Date of Birth:  [birth year]     BSA:          1.524 m Patient Age:    86 years      BP:           136/105 mmHg Patient Gender: F             HR:           45 bpm. Exam Location:  Inpatient Procedure: 2D Echo, Cardiac Doppler and Color Doppler Indications:    Abnormal ECG R94.31  History:        Patient has no prior history of Echocardiogram examinations.                 Arrythmias:Bradycardia; Risk Factors:Hypertension.  Sonographer:    Ronny Flurry Referring Phys: [id number] GRACE LAU IMPRESSIONS  1. Left ventricular  ejection fraction, by estimation, is 60 to 65%. The left ventricle has normal function. The left ventricle has no regional wall motion abnormalities. Left ventricular diastolic parameters are consistent with Grade I diastolic dysfunction (impaired relaxation).  2. Right ventricular systolic function is normal. The right ventricular size is normal. There is normal pulmonary artery systolic pressure. The estimated right ventricular systolic pressure is [id number] mmHg.  3. The mitral valve is grossly normal. Trivial mitral valve regurgitation. No evidence of mitral stenosis.  4. The aortic valve is tricuspid. There is mild calcification of the aortic valve. There is mild thickening of the aortic valve. Aortic valve regurgitation is mild. Aortic valve sclerosis is present, with no evidence of aortic valve stenosis.  5. The inferior vena cava is normal in size with greater than 50% respiratory variability, suggesting right atrial pressure of 3 mmHg. FINDINGS  Left Ventricle: Left ventricular ejection fraction, by estimation, is 60 to 65%. The left ventricle has normal function. The left ventricle has no regional wall motion abnormalities. The left ventricular internal cavity size was normal in size. There is  no left ventricular hypertrophy. Left ventricular diastolic parameters are consistent with Grade I diastolic dysfunction (impaired relaxation). Right Ventricle: The right ventricular size is normal. No increase in right ventricular wall thickness. Right ventricular systolic function is normal. There is normal pulmonary artery systolic pressure. The tricuspid regurgitant velocity is 2.07 m/s, and  with an assumed right atrial pressure of 3 mmHg, the estimated right ventricular systolic pressure is [id number] mmHg. Left Atrium: Left atrial size was normal in size. Right Atrium: Right atrial size was normal in size. Pericardium: There is no evidence of pericardial effusion. Mitral Valve: The mitral valve is grossly normal.  Trivial mitral valve regurgitation. No evidence of mitral valve stenosis. Tricuspid Valve: The tricuspid valve is grossly normal. Tricuspid valve regurgitation is trivial. No evidence of tricuspid stenosis. Aortic Valve: The aortic valve is tricuspid. There is mild calcification of the aortic valve. There is mild thickening of the aortic valve. Aortic valve regurgitation is mild. Aortic valve  sclerosis is present, with no evidence of aortic valve stenosis. Pulmonic Valve: The pulmonic valve was grossly normal. Pulmonic valve regurgitation is trivial. No evidence of pulmonic stenosis. Aorta: The aortic root and ascending aorta are structurally normal, with no evidence of dilitation. Venous: The inferior vena cava is normal in size with greater than 50% respiratory variability, suggesting right atrial pressure of 3 mmHg. IAS/Shunts: The atrial septum is grossly normal.  LEFT VENTRICLE PLAX 2D LVIDs:         3.60 cm   Diastology LVOT diam:     1.70 cm   LV e' medial:   5.10 cm/s LV SV:         70        LV E/e' medial: 15.2 LV SV Index:   46 LVOT Area:     2.27 cm  RIGHT VENTRICLE RV S prime:     6.60 cm/s LEFT ATRIUM             Index        RIGHT ATRIUM           Index LA diam:        4.70 cm 3.08 cm/m   RA Area:     16.10 cm LA Vol (A2C):   39.5 ml 25.92 ml/m  RA Volume:   38.60 ml  25.33 ml/m LA Vol (A4C):   36.7 ml 24.09 ml/m LA Biplane Vol: 39.2 ml 25.73 ml/m  AORTIC VALVE LVOT Vmax:   118.00 cm/s LVOT Vmean:  77.400 cm/s LVOT VTI:    0.308 m  AORTA Ao Root diam: 2.50 cm Ao Asc diam:  3.10 cm MITRAL VALVE                TRICUSPID VALVE MV Area (PHT): 3.00 cm     TR Peak grad:   17.1 mmHg MV Decel Time: 253 msec     TR Vmax:        207.00 cm/s MV E velocity: 77.40 cm/s MV A velocity: 103.00 cm/s  SHUNTS MV E/A ratio:  0.75         Systemic VTI:  0.31 m                             Systemic Diam: 1.70 cm Eleonore Chiquito MD Electronically signed by Eleonore Chiquito MD Signature Date/Time: 11/26/2021/3:06:28 PM     Final    DG CHEST PORT 1 VIEW  Result Date: 11/26/2021 CLINICAL DATA:  Chest wall tenderness after fall. EXAM: PORTABLE CHEST 1 VIEW COMPARISON:  Chest x-ray dated June 14, 2016. FINDINGS: The heart size and mediastinal contours are within normal limits. Normal pulmonary vascularity. Peripheral predominant interstitial thickening and opacities in both lungs have progressed since 2018. No focal consolidation, pleural effusion, or pneumothorax. No acute osseous abnormality. IMPRESSION: 1.  No active cardiopulmonary disease. 2. Progressive chronic interstitial lung disease. Electronically Signed   By: Titus Dubin M.D.   On: 11/26/2021 10:16   CT Head Wo Contrast  Result Date: 11/25/2021 CLINICAL DATA:  Recent fall with headaches and neck pain, initial encounter EXAM: CT HEAD WITHOUT CONTRAST CT MAXILLOFACIAL WITHOUT CONTRAST CT CERVICAL SPINE WITHOUT CONTRAST TECHNIQUE: Multidetector CT imaging of the head, cervical spine, and maxillofacial structures were performed using the standard protocol without intravenous contrast. Multiplanar CT image reconstructions of the cervical spine and maxillofacial structures were also generated. RADIATION DOSE REDUCTION: This exam was performed according to the departmental dose-optimization program  which includes automated exposure control, adjustment of the mA and/or kV according to patient size and/or use of iterative reconstruction technique. COMPARISON:  None Available. FINDINGS: CT HEAD FINDINGS Brain: No evidence of acute infarction, hemorrhage, hydrocephalus, extra-axial collection or mass lesion/mass effect. Mild atrophic changes are noted. Vascular: No hyperdense vessel or unexpected calcification. Skull: Normal. Negative for fracture or focal lesion. Other: Mild right forehead swelling is noted consistent with the recent injury. CT MAXILLOFACIAL FINDINGS Osseous: No acute bony abnormality is noted. Orbits: Orbits and their contents are within normal limits.  Sinuses: Paranasal sinuses are unremarkable. Soft tissues: Mild soft tissue swelling is noted to the right of the midline in the forehead consistent with the recent injury. CT CERVICAL SPINE FINDINGS Alignment: Within normal limits. Skull base and vertebrae: 7 cervical segments are well visualized. Multilevel osteophytic changes and facet hypertrophic changes are seen. No acute fracture or acute facet abnormality is noted. The odontoid is within normal limits. Soft tissues and spinal canal: Surrounding soft tissue structures are unremarkable. No focal hematoma is seen. Upper chest: Visualized lung apices are well aerated. Mild scarring is seen. Other: None IMPRESSION: CT of the head: No acute intracranial abnormality noted. CT of the cervical spine: No acute bony abnormality is noted. Mild right forehead swelling is seen. CT of the cervical spine: Multilevel degenerative change without acute abnormality. Electronically Signed   By: Inez Catalina M.D.   On: 11/25/2021 21:53   CT Maxillofacial Wo Contrast  Result Date: 11/25/2021 CLINICAL DATA:  Recent fall with headaches and neck pain, initial encounter EXAM: CT HEAD WITHOUT CONTRAST CT MAXILLOFACIAL WITHOUT CONTRAST CT CERVICAL SPINE WITHOUT CONTRAST TECHNIQUE: Multidetector CT imaging of the head, cervical spine, and maxillofacial structures were performed using the standard protocol without intravenous contrast. Multiplanar CT image reconstructions of the cervical spine and maxillofacial structures were also generated. RADIATION DOSE REDUCTION: This exam was performed according to the departmental dose-optimization program which includes automated exposure control, adjustment of the mA and/or kV according to patient size and/or use of iterative reconstruction technique. COMPARISON:  None Available. FINDINGS: CT HEAD FINDINGS Brain: No evidence of acute infarction, hemorrhage, hydrocephalus, extra-axial collection or mass lesion/mass effect. Mild atrophic  changes are noted. Vascular: No hyperdense vessel or unexpected calcification. Skull: Normal. Negative for fracture or focal lesion. Other: Mild right forehead swelling is noted consistent with the recent injury. CT MAXILLOFACIAL FINDINGS Osseous: No acute bony abnormality is noted. Orbits: Orbits and their contents are within normal limits. Sinuses: Paranasal sinuses are unremarkable. Soft tissues: Mild soft tissue swelling is noted to the right of the midline in the forehead consistent with the recent injury. CT CERVICAL SPINE FINDINGS Alignment: Within normal limits. Skull base and vertebrae: 7 cervical segments are well visualized. Multilevel osteophytic changes and facet hypertrophic changes are seen. No acute fracture or acute facet abnormality is noted. The odontoid is within normal limits. Soft tissues and spinal canal: Surrounding soft tissue structures are unremarkable. No focal hematoma is seen. Upper chest: Visualized lung apices are well aerated. Mild scarring is seen. Other: None IMPRESSION: CT of the head: No acute intracranial abnormality noted. CT of the cervical spine: No acute bony abnormality is noted. Mild right forehead swelling is seen. CT of the cervical spine: Multilevel degenerative change without acute abnormality. Electronically Signed   By: Inez Catalina M.D.   On: 11/25/2021 21:53   CT Cervical Spine Wo Contrast  Result Date: 11/25/2021 CLINICAL DATA:  Recent fall with headaches and neck pain, initial encounter EXAM:  CT HEAD WITHOUT CONTRAST CT MAXILLOFACIAL WITHOUT CONTRAST CT CERVICAL SPINE WITHOUT CONTRAST TECHNIQUE: Multidetector CT imaging of the head, cervical spine, and maxillofacial structures were performed using the standard protocol without intravenous contrast. Multiplanar CT image reconstructions of the cervical spine and maxillofacial structures were also generated. RADIATION DOSE REDUCTION: This exam was performed according to the departmental dose-optimization program  which includes automated exposure control, adjustment of the mA and/or kV according to patient size and/or use of iterative reconstruction technique. COMPARISON:  None Available. FINDINGS: CT HEAD FINDINGS Brain: No evidence of acute infarction, hemorrhage, hydrocephalus, extra-axial collection or mass lesion/mass effect. Mild atrophic changes are noted. Vascular: No hyperdense vessel or unexpected calcification. Skull: Normal. Negative for fracture or focal lesion. Other: Mild right forehead swelling is noted consistent with the recent injury. CT MAXILLOFACIAL FINDINGS Osseous: No acute bony abnormality is noted. Orbits: Orbits and their contents are within normal limits. Sinuses: Paranasal sinuses are unremarkable. Soft tissues: Mild soft tissue swelling is noted to the right of the midline in the forehead consistent with the recent injury. CT CERVICAL SPINE FINDINGS Alignment: Within normal limits. Skull base and vertebrae: 7 cervical segments are well visualized. Multilevel osteophytic changes and facet hypertrophic changes are seen. No acute fracture or acute facet abnormality is noted. The odontoid is within normal limits. Soft tissues and spinal canal: Surrounding soft tissue structures are unremarkable. No focal hematoma is seen. Upper chest: Visualized lung apices are well aerated. Mild scarring is seen. Other: None IMPRESSION: CT of the head: No acute intracranial abnormality noted. CT of the cervical spine: No acute bony abnormality is noted. Mild right forehead swelling is seen. CT of the cervical spine: Multilevel degenerative change without acute abnormality. Electronically Signed   By: Inez Catalina M.D.   On: 11/25/2021 21:53   DG Knee Complete 4 Views Right  Result Date: 11/25/2021 CLINICAL DATA:  fall EXAM: RIGHT KNEE - COMPLETE 4+ VIEW COMPARISON:  None Available. FINDINGS: No evidence of fracture, dislocation, or joint effusion. No evidence of arthropathy or other focal bone abnormality. Soft  tissues are unremarkable. Severe vascular calcifications. IMPRESSION: No acute displaced fracture or dislocation. Electronically Signed   By: Iven Finn M.D.   On: 11/25/2021 20:29    Cardiac Studies   Echocardiogram 11/26/2021: Impressions: 1. Left ventricular ejection fraction, by estimation, is 60 to 65%. The  left ventricle has normal function. The left ventricle has no regional  wall motion abnormalities. Left ventricular diastolic parameters are  consistent with Grade I diastolic  dysfunction (impaired relaxation).   2. Right ventricular systolic function is normal. The right ventricular  size is normal. There is normal pulmonary artery systolic pressure. The  estimated right ventricular systolic pressure is [id number] mmHg.   3. The mitral valve is grossly normal. Trivial mitral valve  regurgitation. No evidence of mitral stenosis.   4. The aortic valve is tricuspid. There is mild calcification of the  aortic valve. There is mild thickening of the aortic valve. Aortic valve  regurgitation is mild. Aortic valve sclerosis is present, with no evidence  of aortic valve stenosis.   5. The inferior vena cava is normal in size with greater than 50%  respiratory variability, suggesting right atrial pressure of 3 mmHg.   Patient Profile     history of bradycardia, hypertension, and underlying pulmonary disease who is being seen 11/26/2021 for the evaluation of bradycardia  at the request of Cherlynn June, PA-C (Emergency Department).  Assessment & Plan    Asymptomatic Bradycardia  Patient presented after a witnessed mechanical fall and was noted to be bradycardia with rates in the 40s. EKG shows sinus bradycardia with no AV block. Initial potassium was 6.1 on I-stat labs. She received one dose of Lokelma. Potassium within normal limits on BMET today. Magnesium and TSH normal. Echo showed LVEF of 60-65% with grade 1 diastolic dysfunction, mild AI, and trivial MR. - Telemetry shows sinus  bradycardia with rates mostly in the 40s to 50s but increase to the 90s with ambulation. - Hemodynamically stable.  - Not on any AV nodal agents at home. Continue to avoid. - It sounds like this is not a new problems. She was previously told she had bradycardia a couple of years ago while in [place]. She is completely asymptomatic with this. No additional cardiac work-up. No indication for PPM at this time.   Hypertension BP initially as high as 173/52. However, has since improved. Mostly well controlled now. Diastolic BP as low as the 30s to 40s at times. - Looks like patient is on Enalapril at home but waiting for medications reconciliation. Given hyperkalemia on admission, would not restart this. If another agent is needed, could try Amlodipine.   Acute on CKD Stage III Creatinine 1.20 on admission and up to 1.46 today. Last known creatinine 1.07 in 2018. - Continue to hold Enalapril. - Otherwise, management per primary team.   Hyperkalemia Potassium was initially 6.1 on admission but this was on I-stat labs. She did receive a dose of Lokelma with normalization of potassium. Unclear if initial value accurate given it was on I-stat labs. - Potassium 4.6 today. - Continue to monitor.    Otherwise, per primary team - Mechanical fall with abrasions on face  - Chronic interstitial lung disease noted on x-ray  For questions or updates, please contact [place] Please consult [email or web address] for contact info under        Signed, Darreld Mclean, PA-C  11/27/2021, 10:19 AM    I have seen and examined the patient along with Darreld Mclean, PA-C .  I have reviewed the chart, notes and new data.  I agree with PA/NP's note.  [place] will sign off.   Medication Recommendations:  avoid beta blockers, centrally acting calcium channel blockers, clonidine, cholinesterase inhibitors Other recommendations (labs, testing, etc):  n/a Follow up as an outpatient:   reasonable to have yearly Cardiology follow up (sooner if symptomatic) whether here or in [place].   Sanda Klein, MD, [place] [phone] 11/27/2021, 1:48 PM

## 2021-11-27 NOTE — Evaluation (Signed)
Occupational Therapy Evaluation Patient Details Name: Summer Martinez MRN: [id number] DOB: [birth year] Today's Date: 11/27/2021   History of Present Illness Pt is an 86 yo female admitted after a mechanical fall in home resulting in facial lacerations only. WUJ:[id number], HTN, inhalers   Clinical Impression   Pt admitted with the above diagnosis and has the deficits outlined below. Pt would benefit from cont OT to increase basic balance and stability during all adls. Pt lives with husband and daughter and is mildly unsteady on feet at this time and was admitted for a fall.  Pt could return home if daughter is with pt at all times to start.  Pt was very nervous during eval and improved greatly as the session went on therefore feel pt can return home when medically stable if daughter is available 24/7 for first few days.  Spoke through translator [phone] to relay all this info to the pt. Will continue to see with focus on adls in standing to address balance issues.       Recommendations for follow up therapy are one component of a multi-disciplinary discharge planning process, led by the attending physician.  Recommendations may be updated based on patient status, additional functional criteria and insurance authorization.   Follow Up Recommendations  Home health OT    Assistance Recommended at Discharge Frequent or constant Supervision/Assistance  Patient can return home with the following A little help with walking and/or transfers;A little help with bathing/dressing/bathroom;Assistance with cooking/housework;Direct supervision/assist for medications management;Assist for transportation;Help with stairs or ramp for entrance    Functional Status Assessment  Patient has had a recent decline in their functional status and demonstrates the ability to make significant improvements in function in a reasonable and predictable amount of time.  Equipment Recommendations  None recommended by OT     Recommendations for Other Services       Precautions / Restrictions Precautions Precautions: Fall Precaution Comments: admit bc of fall but has not had a fall in a long time Restrictions Weight Bearing Restrictions: No Other Position/Activity Restrictions: watch HR      Mobility Bed Mobility Overal bed mobility: Needs Assistance Bed Mobility: Supine to Sit, Sit to Supine     Supine to sit: Min assist Sit to supine: Min guard   General bed mobility comments: required assist to come to full sit.    Transfers Overall transfer level: Needs assistance Equipment used: 1 person hand held assist Transfers: Sit to/from Stand, Bed to chair/wheelchair/BSC Sit to Stand: Min assist     Step pivot transfers: Min assist     General transfer comment: steadying assist and cues for hand placement.      Balance Overall balance assessment: Needs assistance Sitting-balance support: Feet supported Sitting balance-Leahy Scale: Good     Standing balance support: Single extremity supported, During functional activity Standing balance-Leahy Scale: Fair Standing balance comment: Pt required assist for dynamic tasks and walking                           ADL either performed or assessed with clinical judgement   ADL Overall ADL's : Needs assistance/impaired Eating/Feeding: Set up;Sitting   Grooming: Wash/dry hands;Wash/dry face;Oral care;Min guard;Standing;Cueing for compensatory techniques Grooming Details (indicate cue type and reason): mildly unsteady standing at sink but improved as time progressed. Upper Body Bathing: Set up   Lower Body Bathing: Minimal assistance;Sit to/from stand   Upper Body Dressing : Set up;Sitting   Lower Body Dressing: Minimal  assistance;Sit to/from stand;Cueing for compensatory techniques   Toilet Transfer: Minimal assistance;Ambulation;Regular Toilet   Toileting- Clothing Manipulation and Hygiene: Minimal assistance;Sit to/from  stand;Cueing for compensatory techniques       Functional mobility during ADLs: Minimal assistance General ADL Comments: Pt slightly limited due to being very nervous and slightly off balance when walking. Walked with and without walker. Pt did better without but uses cane at home so feel that would be best option.     Vision Baseline Vision/History: 0 No visual deficits Ability to See in Adequate Light: 0 Adequate Patient Visual Report: No change from baseline Vision Assessment?: No apparent visual deficits     Perception     Praxis      Pertinent Vitals/Pain Pain Assessment Pain Assessment: No/denies pain     Hand Dominance Left   Extremity/Trunk Assessment Upper Extremity Assessment Upper Extremity Assessment: Overall WFL for tasks assessed   Lower Extremity Assessment Lower Extremity Assessment: Defer to PT evaluation       Communication Communication Communication: Prefers language other than English   Cognition Arousal/Alertness: Awake/alert Behavior During Therapy: Anxious Overall Cognitive Status: History of cognitive impairments - at baseline                                 General Comments: Daughter states that she has some mild confusion and baseline and has been nervous in hosptial with husband at home alone. Daughter feels she is a or close to baseline. Difficult to assess due to language barrier. Pt did not konw month or year but did know what why she was in hospital.     General Comments  Pt most limited by decreased balance and nervousness.    Exercises     Shoulder Instructions      Home Living Family/patient expects to be discharged to:: Private residence Living Arrangements: Children Available Help at Discharge: Available 24 hours/day Type of Home: House Home Access: Stairs to enter [employer] of Steps: 3 Entrance Stairs-Rails: None Home Layout: One level     Bathroom Shower/Tub: Contractor: Standard     Home Equipment: Production assistant, radio - single point   Additional Comments: lives with daughter and husband. Daughter there to care for her parents      Prior Functioning/Environment Prior Level of Function : Independent/Modified Independent;History of Falls (last six months)             Mobility Comments: walks with cane at times ADLs Comments: does most adls with supervision        OT Problem List: Impaired balance (sitting and/or standing);Decreased cognition;Decreased knowledge of use of DME or AE;Decreased safety awareness;Decreased knowledge of precautions      OT Treatment/Interventions: Self-care/ADL training;Patient/family education;Therapeutic activities    OT Goals(Current goals can be found in the care plan section) Acute Rehab OT Goals Patient Stated Goal: none stated OT Goal Formulation: With patient/family Time For Goal Achievement: 12/11/21 Potential to Achieve Goals: Good ADL Goals Pt Will Perform Grooming: with supervision;standing Pt Will Perform Lower Body Bathing: with supervision;sit to/from stand Pt Will Perform Lower Body Dressing: with supervision;sit to/from stand Pt Will Perform Tub/Shower Transfer: Tub transfer;with supervision;ambulating;shower seat Additional ADL Goal #1: Pt wil walk to bathroom and complete all toileting tasks with supervision.  OT Frequency: Min 2X/week    Co-evaluation              AM-PAC OT "6 Clicks"  Daily Activity     Outcome Measure Help from another person eating meals?: None Help from another person taking care of personal grooming?: None Help from another person toileting, which includes using toliet, bedpan, or urinal?: A Little Help from another person bathing (including washing, rinsing, drying)?: A Little Help from another person to put on and taking off regular upper body clothing?: None Help from another person to put on and taking off regular lower body clothing?: A Little 6 Click  Score: 21   End of Session Nurse Communication: Mobility status  Activity Tolerance: Patient tolerated treatment well Patient left: in bed;with family/visitor present;with call bell/phone within reach  OT Visit Diagnosis: Unsteadiness on feet (R26.81)                Time: 5732-2025 OT Time Calculation (min): 37 min Charges:  OT General Charges $OT Visit: 1 Visit OT Evaluation $OT Eval Moderate Complexity: 1 Mod OT Treatments $Self Care/Home Management : 8-22 mins  Hope Budds 11/27/2021, 9:15 AM

## 2021-11-27 NOTE — Discharge Instructions (Addendum)
Estimada Sra. Summer Martinez, ha sido un placer atenderla y estoy muy feliz de verla bien! Lo hospitalizaron por [facility] Mems cada y se qued para controlar su frecuencia cardaca baja. Tambin fuiste atendido por fisioterapia.  Cuando le den el alta, nos gustara que hiciera lo siguiente:  1. Contine tomando sus medicamentos como lo haca antes de llegar al hospital.  2. Consulte a su mdico. Su cita est programada para el 23 de septiembre a las 10:15 am con el Dr. Elliot Gurney --------------------------------------------------------------------------------------------------------------------------------------------------------------------  Dear Ms. Summer Martinez, it has been a pleasure caring for you and I am so happy to see you doing well! You were hospitalized for a fall and stayed to monitor your low heart rate. You were also seen by physical therapy.  When you are discharged we would like you to do the following:  1. Continue taking your medications as you were before you came to the hospital.  2. See your doctor.  Your appointment is scheduled for September 29th at 10:15 am with Dr. Elliot Gurney

## 2021-11-27 NOTE — Discharge Summary (Addendum)
Name: Summer Martinez MRN: [id number] DOB: 04/12/1935 86 y.o. PCP: Pcp, No  Date of Admission: 11/25/2021  7:32 PM Date of Discharge: 11/27/2021 Attending Physician: Dr.  Sol Martinez  Discharge Diagnosis: Principal Problem:   Bradycardia Active Problems:   Essential hypertension   Fall    Discharge Medications: Allergies as of 11/27/2021   No Known Allergies      Medication List     STOP taking these medications    benzonatate 100 MG capsule Commonly known as: TESSALON   tiotropium 18 MCG inhalation capsule Commonly known as: SPIRIVA       TAKE these medications    budesonide-formoterol 80-4.5 MCG/ACT inhaler Commonly known as: SYMBICORT Inhale 2 puffs into the lungs 2 (two) times daily.   enalapril 10 MG tablet Commonly known as: VASOTEC Take 5 mg by mouth daily.        Disposition and follow-up:   [name] was discharged from [facility] in Stable condition.  At the hospital follow up visit please address:  1.  Follow-up:  A. Asymptomatic bradycardia- Patient has a chronic history of asymptomatic bradycardia. There is not an indication for permanent pacemaker at this time. Please continue to monitor.     B. Hypertension- Patient has a history of HTN, SBP in the hospital ranged from 120s-150s, patient was taking enalapril for many years. Please adjust medications to your discretion for better blood pressure control. Please check BMP for potassium.   C. Mechanical falls- Came to the hospital due to this. Patient has abrasions on her face. PT/OT recommended home health. Evaluate if extra support is needed.   D. Possible CKD- Patient had elevated creatinine during hospital stay. Please check BMP and adjust medications for kidney function control.   2.  Labs / imaging needed at time of follow-up: EKG, BMP  3.  Pending labs/ test needing follow-up: none  Follow-up Appointments:  Follow-up Information     Summer Bene, MD Follow up  on 12/04/2021.   Specialty: Internal Medicine Why: Acuda a esta cita a las 10:15 am, por favor llegue 15 minutos antes.  Go to this appointment at 10:15 am, please arrive 15 minutes early. Contact information: [address] [place] [address] [phone]                 Hospital Course by problem list:  Asymptomatic bradycardia No evidence of heart block on EKG. Patient has been asymptomatic from bradycardia for many years but did not have follow-up. Echo showed mild aortic valve thickening and calcification. Patient's PTA medications are enalapril and albuterol which are unlikely to cause bradycardia. Cardiology consulted and noted no PPM indicated at this time. Follow-up with PCP.   Witnessed mechanical fall CT head, cervical and maxillofacial was negative for fracture. CXR showed no active cardiopulmonary disease, showed progressive chronic interstitial lung disease.  Hypertension Patient taking enalapril at home. Her SBP ranged from 120s-150s in the hospital. Blood pressure only mildly elevated. She can most likely resume enalapril at home to help with HTN and CKD.   Possible CKD Last known creatinine 1.07 in 2018, looks like she had a mild renal dysfunction back in 2018.     Subjective:  Patient was seen in bed this AM. She notes some chronic SOB but otherwise, she denies dizziness, pain, and confusion. She says at home, she typically walks without a cane. Lives with daughter.  Discharge Vitals:   BP (!) 147/46 (BP Location: Right Arm)   Pulse (!) 55  Temp (!) 97.4 F (36.3 C) (Oral)   Resp 17   Ht 4\' 11"  (1.499 m)   Wt 47.7 kg   SpO2 94%   BMI 21.25 kg/m   Discharge exam: General: Pleasant, well-appearing in bed. No acute distress. CV: RRR. No murmurs, rubs, or gallops.  Pulmonary: Lungs CTAB. Normal effort. No wheezing or rales. Skin: Warm and dry. Forehead abrasions covered with bandage. She has some abrasions on the nasal region as  well. Neuro: No focal deficit. Psych: Normal mood and affect   Pertinent Labs, Studies, and Procedures:     Latest Ref Rng & Units 11/27/2021    4:37 AM 11/25/2021    8:41 PM 11/25/2021    8:26 PM  CBC  WBC 4.0 - 10.5 K/uL 4.9   7.9   Hemoglobin 12.0 - 15.0 g/dL 11.0  12.2  11.6   Hematocrit 36.0 - 46.0 % 34.1  36.0  35.0   Platelets 150 - 400 K/uL 219   261        Latest Ref Rng & Units 11/27/2021    4:37 AM 11/26/2021    7:32 AM 11/25/2021    8:41 PM  CMP  Glucose 70 - 99 mg/dL 127  133  152   BUN 8 - 23 mg/dL 25  29  48   Creatinine 0.44 - 1.00 mg/dL 1.46  1.25  1.20   Sodium 135 - 145 mmol/L 138  136  135   Potassium 3.5 - 5.1 mmol/L 4.6  4.6  6.1   Chloride 98 - 111 mmol/L 105  108  107   CO2 22 - 32 mmol/L 25  23    Calcium 8.9 - 10.3 mg/dL 8.8  8.8      ECHOCARDIOGRAM COMPLETE  Result Date: 11/26/2021    ECHOCARDIOGRAM REPORT   Patient Name:   Summer Martinez Date of Exam: 11/26/2021 Medical Rec #:  [id number]     Height:       59.0 in Accession #:    [id number]    Weight:       127.6 lb Date of Birth:  03/25/1935     BSA:          1.524 m Patient Age:    29 years      BP:           136/105 mmHg Patient Gender: F             HR:           45 bpm. Exam Location:  Inpatient Procedure: 2D Echo, Cardiac Doppler and Color Doppler Indications:    Abnormal ECG R94.31  History:        Patient has no prior history of Echocardiogram examinations.                 Arrythmias:Bradycardia; Risk Factors:Hypertension.  Sonographer:    Summer Martinez Referring Phys: [id number] Summer Martinez IMPRESSIONS  1. Left ventricular ejection fraction, by estimation, is 60 to 65%. The left ventricle has normal function. The left ventricle has no regional wall motion abnormalities. Left ventricular diastolic parameters are consistent with Grade I diastolic dysfunction (impaired relaxation).  2. Right ventricular systolic function is normal. The right ventricular size is normal. There is normal pulmonary artery  systolic pressure. The estimated right ventricular systolic pressure is [id number] mmHg.  3. The mitral valve is grossly normal. Trivial mitral valve regurgitation. No evidence of mitral stenosis.  4. The aortic valve is tricuspid. There is mild calcification  of the aortic valve. There is mild thickening of the aortic valve. Aortic valve regurgitation is mild. Aortic valve sclerosis is present, with no evidence of aortic valve stenosis.  5. The inferior vena cava is normal in size with greater than 50% respiratory variability, suggesting right atrial pressure of 3 mmHg. FINDINGS  Left Ventricle: Left ventricular ejection fraction, by estimation, is 60 to 65%. The left ventricle has normal function. The left ventricle has no regional wall motion abnormalities. The left ventricular internal cavity size was normal in size. There is  no left ventricular hypertrophy. Left ventricular diastolic parameters are consistent with Grade I diastolic dysfunction (impaired relaxation). Right Ventricle: The right ventricular size is normal. No increase in right ventricular wall thickness. Right ventricular systolic function is normal. There is normal pulmonary artery systolic pressure. The tricuspid regurgitant velocity is 2.07 m/s, and  with an assumed right atrial pressure of 3 mmHg, the estimated right ventricular systolic pressure is [id number] mmHg. Left Atrium: Left atrial size was normal in size. Right Atrium: Right atrial size was normal in size. Pericardium: There is no evidence of pericardial effusion. Mitral Valve: The mitral valve is grossly normal. Trivial mitral valve regurgitation. No evidence of mitral valve stenosis. Tricuspid Valve: The tricuspid valve is grossly normal. Tricuspid valve regurgitation is trivial. No evidence of tricuspid stenosis. Aortic Valve: The aortic valve is tricuspid. There is mild calcification of the aortic valve. There is mild thickening of the aortic valve. Aortic valve regurgitation is mild. Aortic  valve sclerosis is present, with no evidence of aortic valve stenosis. Pulmonic Valve: The pulmonic valve was grossly normal. Pulmonic valve regurgitation is trivial. No evidence of pulmonic stenosis. Aorta: The aortic root and ascending aorta are structurally normal, with no evidence of dilitation. Venous: The inferior vena cava is normal in size with greater than 50% respiratory variability, suggesting right atrial pressure of 3 mmHg. IAS/Shunts: The atrial septum is grossly normal.  LEFT VENTRICLE PLAX 2D LVIDs:         3.60 cm   Diastology LVOT diam:     1.70 cm   LV e' medial:   5.10 cm/s LV SV:         70        LV E/e' medial: 15.2 LV SV Index:   46 LVOT Area:     2.27 cm  RIGHT VENTRICLE RV S prime:     6.60 cm/s LEFT ATRIUM             Index        RIGHT ATRIUM           Index LA diam:        4.70 cm 3.08 cm/m   RA Area:     16.10 cm LA Vol (A2C):   39.5 ml 25.92 ml/m  RA Volume:   38.60 ml  25.33 ml/m LA Vol (A4C):   36.7 ml 24.09 ml/m LA Biplane Vol: 39.2 ml 25.73 ml/m  AORTIC VALVE LVOT Vmax:   118.00 cm/s LVOT Vmean:  77.400 cm/s LVOT VTI:    0.308 m  AORTA Ao Root diam: 2.50 cm Ao Asc diam:  3.10 cm MITRAL VALVE                TRICUSPID VALVE MV Area (PHT): 3.00 cm     TR Peak grad:   17.1 mmHg MV Decel Time: 253 msec     TR Vmax:        207.00 cm/s MV E velocity:  77.40 cm/s MV A velocity: 103.00 cm/s  SHUNTS MV E/A ratio:  0.75         Systemic VTI:  0.31 m                             Systemic Diam: 1.70 cm Summer Chiquito MD Electronically signed by Summer Chiquito MD Signature Date/Time: 11/26/2021/3:06:28 PM    Final    DG CHEST PORT 1 VIEW  Result Date: 11/26/2021 CLINICAL DATA:  Chest wall tenderness after fall. EXAM: PORTABLE CHEST 1 VIEW COMPARISON:  Chest x-ray dated June 14, 2016. FINDINGS: The heart size and mediastinal contours are within normal limits. Normal pulmonary vascularity. Peripheral predominant interstitial thickening and opacities in both lungs have progressed since 2018.  No focal consolidation, pleural effusion, or pneumothorax. No acute osseous abnormality. IMPRESSION: 1.  No active cardiopulmonary disease. 2. Progressive chronic interstitial lung disease. Electronically Signed   By: Titus Dubin M.D.   On: 11/26/2021 10:16   CT Head Wo Contrast  Result Date: 11/25/2021 CLINICAL DATA:  Recent fall with headaches and neck pain, initial encounter EXAM: CT HEAD WITHOUT CONTRAST CT MAXILLOFACIAL WITHOUT CONTRAST CT CERVICAL SPINE WITHOUT CONTRAST TECHNIQUE: Multidetector CT imaging of the head, cervical spine, and maxillofacial structures were performed using the standard protocol without intravenous contrast. Multiplanar CT image reconstructions of the cervical spine and maxillofacial structures were also generated. RADIATION DOSE REDUCTION: This exam was performed according to the departmental dose-optimization program which includes automated exposure control, adjustment of the mA and/or kV according to patient size and/or use of iterative reconstruction technique. COMPARISON:  None Available. FINDINGS: CT HEAD FINDINGS Brain: No evidence of acute infarction, hemorrhage, hydrocephalus, extra-axial collection or mass lesion/mass effect. Mild atrophic changes are noted. Vascular: No hyperdense vessel or unexpected calcification. Skull: Normal. Negative for fracture or focal lesion. Other: Mild right forehead swelling is noted consistent with the recent injury. CT MAXILLOFACIAL FINDINGS Osseous: No acute bony abnormality is noted. Orbits: Orbits and their contents are within normal limits. Sinuses: Paranasal sinuses are unremarkable. Soft tissues: Mild soft tissue swelling is noted to the right of the midline in the forehead consistent with the recent injury. CT CERVICAL SPINE FINDINGS Alignment: Within normal limits. Skull base and vertebrae: 7 cervical segments are well visualized. Multilevel osteophytic changes and facet hypertrophic changes are seen. No acute fracture or  acute facet abnormality is noted. The odontoid is within normal limits. Soft tissues and spinal canal: Surrounding soft tissue structures are unremarkable. No focal hematoma is seen. Upper chest: Visualized lung apices are well aerated. Mild scarring is seen. Other: None IMPRESSION: CT of the head: No acute intracranial abnormality noted. CT of the cervical spine: No acute bony abnormality is noted. Mild right forehead swelling is seen. CT of the cervical spine: Multilevel degenerative change without acute abnormality. Electronically Signed   By: Inez Catalina M.D.   On: 11/25/2021 21:53   CT Maxillofacial Wo Contrast  Result Date: 11/25/2021 CLINICAL DATA:  Recent fall with headaches and neck pain, initial encounter EXAM: CT HEAD WITHOUT CONTRAST CT MAXILLOFACIAL WITHOUT CONTRAST CT CERVICAL SPINE WITHOUT CONTRAST TECHNIQUE: Multidetector CT imaging of the head, cervical spine, and maxillofacial structures were performed using the standard protocol without intravenous contrast. Multiplanar CT image reconstructions of the cervical spine and maxillofacial structures were also generated. RADIATION DOSE REDUCTION: This exam was performed according to the departmental dose-optimization program which includes automated exposure control, adjustment of the mA and/or kV  according to patient size and/or use of iterative reconstruction technique. COMPARISON:  None Available. FINDINGS: CT HEAD FINDINGS Brain: No evidence of acute infarction, hemorrhage, hydrocephalus, extra-axial collection or mass lesion/mass effect. Mild atrophic changes are noted. Vascular: No hyperdense vessel or unexpected calcification. Skull: Normal. Negative for fracture or focal lesion. Other: Mild right forehead swelling is noted consistent with the recent injury. CT MAXILLOFACIAL FINDINGS Osseous: No acute bony abnormality is noted. Orbits: Orbits and their contents are within normal limits. Sinuses: Paranasal sinuses are unremarkable. Soft  tissues: Mild soft tissue swelling is noted to the right of the midline in the forehead consistent with the recent injury. CT CERVICAL SPINE FINDINGS Alignment: Within normal limits. Skull base and vertebrae: 7 cervical segments are well visualized. Multilevel osteophytic changes and facet hypertrophic changes are seen. No acute fracture or acute facet abnormality is noted. The odontoid is within normal limits. Soft tissues and spinal canal: Surrounding soft tissue structures are unremarkable. No focal hematoma is seen. Upper chest: Visualized lung apices are well aerated. Mild scarring is seen. Other: None IMPRESSION: CT of the head: No acute intracranial abnormality noted. CT of the cervical spine: No acute bony abnormality is noted. Mild right forehead swelling is seen. CT of the cervical spine: Multilevel degenerative change without acute abnormality. Electronically Signed   By: Inez Catalina M.D.   On: 11/25/2021 21:53   CT Cervical Spine Wo Contrast  Result Date: 11/25/2021 CLINICAL DATA:  Recent fall with headaches and neck pain, initial encounter EXAM: CT HEAD WITHOUT CONTRAST CT MAXILLOFACIAL WITHOUT CONTRAST CT CERVICAL SPINE WITHOUT CONTRAST TECHNIQUE: Multidetector CT imaging of the head, cervical spine, and maxillofacial structures were performed using the standard protocol without intravenous contrast. Multiplanar CT image reconstructions of the cervical spine and maxillofacial structures were also generated. RADIATION DOSE REDUCTION: This exam was performed according to the departmental dose-optimization program which includes automated exposure control, adjustment of the mA and/or kV according to patient size and/or use of iterative reconstruction technique. COMPARISON:  None Available. FINDINGS: CT HEAD FINDINGS Brain: No evidence of acute infarction, hemorrhage, hydrocephalus, extra-axial collection or mass lesion/mass effect. Mild atrophic changes are noted. Vascular: No hyperdense vessel or  unexpected calcification. Skull: Normal. Negative for fracture or focal lesion. Other: Mild right forehead swelling is noted consistent with the recent injury. CT MAXILLOFACIAL FINDINGS Osseous: No acute bony abnormality is noted. Orbits: Orbits and their contents are within normal limits. Sinuses: Paranasal sinuses are unremarkable. Soft tissues: Mild soft tissue swelling is noted to the right of the midline in the forehead consistent with the recent injury. CT CERVICAL SPINE FINDINGS Alignment: Within normal limits. Skull base and vertebrae: 7 cervical segments are well visualized. Multilevel osteophytic changes and facet hypertrophic changes are seen. No acute fracture or acute facet abnormality is noted. The odontoid is within normal limits. Soft tissues and spinal canal: Surrounding soft tissue structures are unremarkable. No focal hematoma is seen. Upper chest: Visualized lung apices are well aerated. Mild scarring is seen. Other: None IMPRESSION: CT of the head: No acute intracranial abnormality noted. CT of the cervical spine: No acute bony abnormality is noted. Mild right forehead swelling is seen. CT of the cervical spine: Multilevel degenerative change without acute abnormality. Electronically Signed   By: Inez Catalina M.D.   On: 11/25/2021 21:53   DG Knee Complete 4 Views Right  Result Date: 11/25/2021 CLINICAL DATA:  fall EXAM: RIGHT KNEE - COMPLETE 4+ VIEW COMPARISON:  None Available. FINDINGS: No evidence of fracture, dislocation, or  joint effusion. No evidence of arthropathy or other focal bone abnormality. Soft tissues are unremarkable. Severe vascular calcifications. IMPRESSION: No acute displaced fracture or dislocation. Electronically Signed   By: Iven Finn M.D.   On: 11/25/2021 20:29     Discharge Instructions: Discharge Instructions     Diet - low sodium heart healthy   Complete by: As directed    Increase activity slowly   Complete by: As directed          Discharge  Instructions      Estimada Sra. Marva Panda, ha sido un placer atenderla y estoy muy feliz de verla bien! Lo hospitalizaron por Ardelia Mems cada y se qued para controlar su frecuencia cardaca baja. Tambin fuiste atendido por fisioterapia.  Cuando le den el alta, nos gustara que hiciera lo siguiente:  1. Contine tomando sus medicamentos como lo haca antes de llegar al hospital.  2. Consulte a su mdico. Su cita est programada para el 53 de septiembre a las 10:15 am con el Dr. Elliot Gurney --------------------------------------------------------------------------------------------------------------------------------------------------------------------  Dear Ms. Lillis, it has been a pleasure caring for you and I am so happy to see you doing well! You were hospitalized for a fall and stayed to monitor your low heart rate. You were also seen by physical therapy.  When you are discharged we would like you to do the following:  1. Continue taking your medications as you were before you came to the hospital.  2. See your doctor.  Your appointment is scheduled for September 29th at 10:15 am with Dr. Elliot Gurney      Signed: Stormy Fabian, MD 11/27/2021, 1:54 PM   Pager: [phone]

## 2021-11-27 NOTE — Evaluation (Signed)
Physical Therapy Evaluation Patient Details Name: Summer Martinez MRN: [id number] DOB: [birth year] Today's Date: 11/27/2021  History of Present Illness  Pt is an 86 yo female admitted after a mechanical fall in home resulting in facial lacerations only. [id number], HTN, inhalers  Clinical Impression  Pt presents to PT without significant deficits in mobility compared to her baseline. Pt is able to ambulate for household distances with support of cane, pt is able to maintain standing balance without UE support at this time. Pt does utilize assistance of daughter to perform bed mobility, however she has this level of assistance at home. Pt demonstrates no further acute PT needs at this time. Acute PT signing off.       Recommendations for follow up therapy are one component of a multi-disciplinary discharge planning process, led by the attending physician.  Recommendations may be updated based on patient status, additional functional criteria and insurance authorization.  Follow Up Recommendations No PT follow up      Assistance Recommended at Discharge PRN  Patient can return home with the following       Equipment Recommendations None recommended by PT  Recommendations for Other Services       Functional Status Assessment Patient has not had a recent decline in their functional status     Precautions / Restrictions Precautions Precautions: Fall Precaution Comments: first fall per patient Restrictions Weight Bearing Restrictions: No Other Position/Activity Restrictions: bradycardia, HR increases appropriately with mobility      Mobility  Bed Mobility Overal bed mobility: Needs Assistance Bed Mobility: Supine to Sit     Supine to sit: Min assist     General bed mobility comments: daughter providing hand hold to allow pt to pull    Transfers Overall transfer level: Modified independent   Transfers: Sit to/from Stand Sit to Stand: Modified independent  (Device/Increase time)           General transfer comment: sit to stand with cane    Ambulation/Gait Ambulation/Gait assistance: Modified independent (Device/Increase time) Gait Distance (Feet): 150 Feet Assistive device: Straight cane Gait Pattern/deviations: Step-through pattern Gait velocity: functional Gait velocity interpretation: 1.31 - 2.62 ft/sec, indicative of limited community ambulator   General Gait Details: steady step-through gait with support of cane  Stairs Stairs:  (pt reports no concerns with her ability to negotiate steps with railing)          Wheelchair Mobility    Modified Rankin (Stroke Patients Only)       Balance Overall balance assessment: Needs assistance Sitting-balance support: No upper extremity supported, Feet supported Sitting balance-Leahy Scale: Good     Standing balance support: No upper extremity supported, During functional activity Standing balance-Leahy Scale: Fair                               Pertinent Vitals/Pain Pain Assessment Pain Assessment: No/denies pain    Home Living Family/patient expects to be discharged to:: Private residence Living Arrangements: Children Available Help at Discharge: Available 24 hours/day Type of Home: House Home Access: Stairs to enter Entrance Stairs-Rails: Left Entrance Stairs-Number of Steps: 3   Home Layout: One level Home Equipment: Shower seat;Cane - single point Additional Comments: lives with daughter and husband. Daughter there to care for her parents    Prior Function Prior Level of Function : Independent/Modified Independent;History of Falls (last six months)             Mobility Comments: walks  without a device in the home, utilizes a cane outdoors ADLs Comments: does most adls with supervision     Hand Dominance   Dominant Hand: Left    Extremity/Trunk Assessment   Upper Extremity Assessment Upper Extremity Assessment: Overall WFL for tasks  assessed    Lower Extremity Assessment Lower Extremity Assessment: Overall WFL for tasks assessed    Cervical / Trunk Assessment Cervical / Trunk Assessment: Normal  Communication   Communication: Prefers language other than English (video interpreter, [name], utilized)  Cognition Arousal/Alertness: Awake/alert Behavior During Therapy: WFL for tasks assessed/performed Overall Cognitive Status: History of cognitive impairments - at baseline                                 General Comments: mild confusion at baseline per daughter        General Comments General comments (skin integrity, edema, etc.): VSS on RA    Exercises     Assessment/Plan    PT Assessment Patient does not need any further PT services  PT Problem List         PT Treatment Interventions      PT Goals (Current goals can be found in the Care Plan section)       Frequency       Co-evaluation               AM-PAC PT "6 Clicks" Mobility  Outcome Measure Help needed turning from your back to your side while in a flat bed without using bedrails?: None Help needed moving from lying on your back to sitting on the side of a flat bed without using bedrails?: A Little Help needed moving to and from a bed to a chair (including a wheelchair)?: None Help needed standing up from a chair using your arms (e.g., wheelchair or bedside chair)?: None Help needed to walk in hospital room?: None Help needed climbing 3-5 steps with a railing? : A Little 6 Click Score: 22    End of Session   Activity Tolerance: Patient tolerated treatment well Patient left: in bed;with call bell/phone within reach;with family/visitor present Nurse Communication: Mobility status PT Visit Diagnosis: Other abnormalities of gait and mobility (R26.89)    Time: 5993-5701 PT Time Calculation (min) (ACUTE ONLY): 17 min   Charges:   PT Evaluation $PT Eval Low Complexity: 1 Low          Zenaida Niece, PT,  DPT Acute Rehabilitation Office [phone]   Zenaida Niece 11/27/2021, 10:26 AM

## 2021-11-30 ENCOUNTER — Ambulatory Visit (INDEPENDENT_AMBULATORY_CARE_PROVIDER_SITE_OTHER): Payer: Self-pay | Admitting: Neurology

## 2021-11-30 ENCOUNTER — Encounter: Payer: Self-pay | Admitting: Neurology

## 2021-11-30 VITALS — BP 153/63 | HR 57 | Ht 59.0 in | Wt 106.6 lb

## 2021-11-30 DIAGNOSIS — G250 Essential tremor: Secondary | ICD-10-CM

## 2021-11-30 NOTE — Progress Notes (Signed)
Subjective:    Patient ID: Summer Martinez is a 86 y.o. female.  HPI    Huston Foley, MD, PhD [facility] [address], [address] [address] [place], [place] [address]  Dear Dr. Wynelle Link,  I saw your patient, Summer Martinez, upon your kind request in my neurologic clinic today for initial consultation of her tremors.  The patient is accompanied by her daughter and a Spanish interpreter today.  As you know, [name] is an 86 year old right-handed woman with an underlying medical history of hypertension, hyperlipidemia, and bradycardia, who reports a hand tremor for about 10 years. She does not have a known family history of tremors, her parents did not have any tremor but mom died young in her 69s and dad died in his 24s.  She had 3 brothers and 4 sisters, none of them had a tremor, she has a total of 15 children, 12 boys and 3 girls, none of them have a tremor.  She has fallen several times, most recently outside when she was coming back in, she tripped over uneven ground.  She fell face forward.  She uses a cane typically outside but not always in the house.  She admits to not drinking much in the way of water, may be just some sips at a time, occasional Gatorade, some soda but not daily.  She drinks 1 cup of tea in the morning, no alcohol, she is a non-smoker.  She does not like to drink water as she has to go to the bathroom more.  Tremors are bothersome and that she has trouble feeding herself or drinking something. I reviewed your office note from 10/26/2021.  She was noted to have a resting tremor in the left upper extremity and right upper extremity at the time.  She is currently not on any prescription medications.  She was recently in the hospital from 11/25/2021 through 11/27/2021 for bradycardia.  She was admitted after a fall.  Chest x-ray revealed chronic interstitial lung disease and laboratory evaluation showed elevated creatinine level with suspicion of CKD.  She had a  head CT without contrast, maxillofacial CT without contrast and cervical spine CT without contrast on 11/25/2021 and I reviewed the results: IMPRESSION: CT of the head: No acute intracranial abnormality noted.   CT of the cervical spine: No acute bony abnormality is noted. Mild right forehead swelling is seen.   CT of the cervical spine: Multilevel degenerative change without acute abnormality.  I reviewed blood test results from the hospital.  Vitamin D level on 11/27/2021 was on the low end at 33.58, magnesium normal at 2.0, CBC without differential showed hemoglobin below normal at 11, hematocrit mildly low at 34.1.  On 11/26/2021 her TSH was normal at 4.227, BMP showed creatinine of 1.25, BUN elevated at 29.  Sodium was normal at 136, potassium 4.6.  She does not sleep very well, she has trouble maintaining sleep and initiating sleep.  Her Past Medical History Is Significant For: History reviewed. No pertinent past medical history.  Her Past Surgical History Is Significant For: Past Surgical History:  Procedure Laterality Date   ABDOMINAL HYSTERECTOMY     APPENDECTOMY      Her Family History Is Significant For: Family History  Problem Relation Age of Onset   Heart disease Neg Hx    Parkinson's disease Neg Hx    Tremor Neg Hx     Her Social History Is Significant For: Social History   Socioeconomic History   Marital  status: Married    Spouse name: Not on file   Number of children: Not on file   Years of education: Not on file   Highest education level: Not on file  Occupational History   Not on file  Tobacco Use   Smoking status: Never   Smokeless tobacco: Never  Substance and Sexual Activity   Alcohol use: No   Drug use: No   Sexual activity: Never  Other Topics Concern   Not on file  Social History Narrative   Not on file   Social Determinants of Health   Financial Resource Strain: Not on file  Food Insecurity: Not on file  Transportation Needs: Not on  file  Physical Activity: Not on file  Stress: Not on file  Social Connections: Not on file    Her Allergies Are:  No Known Allergies:   Her Current Medications Are:  Outpatient Encounter Medications as of 11/30/2021  Medication Sig   budesonide-formoterol (SYMBICORT) 80-4.5 MCG/ACT inhaler Inhale 2 puffs into the lungs 2 (two) times daily.   enalapril (VASOTEC) 10 MG tablet Take 5 mg by mouth daily.   No facility-administered encounter medications on file as of 11/30/2021.  : Review of Systems:  Out of a complete 14 point review of systems, all are reviewed and negative with the exception of these symptoms as listed below: Review of Systems  Neurological:        Pt here for tremors consult  daughter states tremors are in  both hands daughter states she has a hard time picking up cups and eating .     Objective:  Neurological Exam  Physical Exam Physical Examination:   Vitals:   11/30/21 1432  BP: (!) 153/63  Pulse: (!) 57    General Examination: The patient is a very pleasant 86 y.o. female in no acute distress. She appears frail, well-groomed.   HEENT: Normocephalic, bandage on forehead and midface on the right side.  Pupils are reactive to light, extraocular tracking well-preserved, corrective eyeglasses in place.  Hearing grossly intact, face is without obvious masking, no nuchal rigidity, no lip, mucosal tremor, no carotid bruits.  Airway examination reveals moderate mouth dryness, tongue protrudes centrally and palate elevates symmetrically.    Chest: Clear to auscultation without wheezing, rhonchi or crackles noted.  Heart: S1+S2+0, regular and normal without murmurs, rubs or gallops noted.   Abdomen: Soft, non-tender and non-distended.  Extremities: There is edema in the distal lower extremities bilaterally.   Skin: Warm and dry without trophic changes noted.   Musculoskeletal: exam reveals no obvious joint deformities, reports low back pain with walking.    Neurologically:  Mental status: The patient is awake, alert and oriented in all 4 spheres. Her immediate and remote memory, attention, language skills and fund of knowledge are fairly appropriate but she does not give much in the way of details of her history.  She does not read or write.   Cranial nerves II - XII are as described above under HEENT exam.  Motor exam: Thin bulk, global strength of about 4+ out of 5, no resting tremor.  She has a mild to moderate postural tremor in the left upper extremity, mild postural tremor in the right upper extremity, mild action tremor in both upper extremities, no significant intention tremor, no lower extremity tremor.  Romberg is not tested for safety concerns, reflexes are 1+ throughout.   Fine motor skills and coordination: mild difficulty globally with finger taps, hand movements, rapid alternating patting  foot taps.   Cerebellar testing: No dysmetria or intention tremor. There is no truncal or gait ataxia.  Sensory exam: intact to light touch in the upper and lower extremities.  Finger-to-nose is slow but doable, heel-to-shin difficult for her but doable without dysmetria in the lower extremities. Gait, station and balance: She stands with mild difficulty and needs minor help with standing.  She has an increase in lumbar kyphosis, she walks with a single-point cane, no shuffling, she walks slowly and cautiously.    Assessment and Plan:   In summary, Arora Coakley is a very pleasant 86 y.o.-year old female with an underlying medical history of hypertension, hyperlipidemia, and bradycardia, who presents for evaluation of her hand tremors of at least 10 years' duration.  History and examination findings are in keeping with essential tremor although she does not have a family history of tremors; she does come from a large family herself and has a total of 15 kids. Her examination shows a postural and action tremor in both upper extremities, more noticeable on  the left.  No telltale signs of Parkinson's disease or parkinsonism.  Her daughter in particular had questions about Parkinson's disease and she is reassured that I do not believe the patient has Parkinson's disease or Parkinson's-like changes.  Nevertheless, she has a longer standing history of hand tremors and it is bothersome to her.  Unfortunately, she is not a good candidate for a beta-blocker which can further lower her heart rate as she has already bradycardia.  She is not a good candidate for a medication called Mysoline or primidone which we utilize for tremor control because of her chronic kidney disease and fall risk.  It would increase her fall risk and I do not feel comfortable treating her with Mysoline.  At this juncture, we talked about tremor triggers and alleviating factors.  She is advised to stay better hydrated with water and well rested.  Sleep, she may be able to try a low-dose of melatonin over-the-counter such as 0.5 mg strength.  I explained this to her daughter.  The patient is advised to continue to limit her caffeine intake but increase her water intake.  She is advised to use her cane for gait safety and follow-up with your office as scheduled/planned.  I answered all the questions today and the patient and her daughter were in agreement.  Thank you very much for allowing me to participate in the care of this nice patient. If I can be of any further assistance to you please do not hesitate to call me at [phone].  Sincerely,   Star Age, MD, PhD

## 2021-11-30 NOTE — Patient Instructions (Addendum)
You have a tremor of both hands, likely what we call essential tremor or familial.  I do not see any signs or symptoms of parkinson's like disease or what we call parkinsonism.  For your tremor, I would not recommend any new medications at this time, due to risk of side effects with medications, such as sleepiness, low heart rate and increase in fall risk.   Please remember, that any kind of tremor may be exacerbated by anxiety, anger, nervousness, excitement, dehydration, sleep deprivation, thyroid dysfunction, by caffeine, and low blood sugar values or blood sugar fluctuations. Some medications can exacerbate tremors, this includes certain asthma or COPD medications and certain antidepressants.   Please try to drink more water, about 4-6 cups per day, if possible. Follow up with your primary care.

## 2021-12-04 ENCOUNTER — Ambulatory Visit (INDEPENDENT_AMBULATORY_CARE_PROVIDER_SITE_OTHER): Payer: Self-pay | Admitting: Internal Medicine

## 2021-12-04 ENCOUNTER — Other Ambulatory Visit: Payer: Self-pay

## 2021-12-04 ENCOUNTER — Encounter: Payer: Self-pay | Admitting: Internal Medicine

## 2021-12-04 VITALS — BP 163/47 | HR 45

## 2021-12-04 DIAGNOSIS — W19XXXD Unspecified fall, subsequent encounter: Secondary | ICD-10-CM

## 2021-12-04 DIAGNOSIS — I10 Essential (primary) hypertension: Secondary | ICD-10-CM

## 2021-12-04 NOTE — Progress Notes (Unsigned)
   CC: hospital follow up  HPI:[name] is a 86 y.o. female who presents for evaluation of fall. Please see individual problem based A/P for details.  Hospital follow up for mechanical fall. Does complain of msk CP that was present during hospitalization sustained while trying to catch herself during fall. Is improving. Tylenol seems to help. HH OT was recommended. Daughter has been working to get this set up and states she does not require any help to do so. Daughter is able to care for pt majority of day and feels as though she is managing it well.    Patient has been taking enalapril for many years. BP elevated at OV, but patient did not take medication prior to visit. Asymptomatic at rest. Does report some symptoms of orthostatic hypotension when going from sitting to standing. Daughter is working on encouraging more PO intake.  She has known asymptomatic bradycardia.   Depression, PHQ-9: Based on the patients  score we have .  No past medical history on file. Review of Systems:   Review of Systems  Constitutional: Negative.   Eyes: Negative.   Respiratory: Negative.    Cardiovascular:  Positive for chest pain.  Musculoskeletal: Negative.   Skin: Negative.      Physical Exam: Vitals:   12/04/21 1208  BP: (!) 163/47  Pulse: (!) 45     General: NAD HEENT: resolving ecchymosis right forehead and maxilla Cardiovascular: bradycardic.  No murmurs, rubs, or gallops Pulmonary : Equal breath sounds, No wheezes, rales, or rhonchi Abdominal: soft, nontender,  bowel sounds present Ext: No edema in lower extremities, no tenderness to palpation of lower extremities.   Assessment & Plan:   See Encounters Tab for problem based charting.  Patient likely does not require additional medication at this time. Would caution against over treatment, given her history of known bradycardia. Will plan to have patient take BP med day of follow up appointment and recheck BP at that  time.  Patient was also noted to have hyperkalemia during her hospitalization. This has resolved on repeat BMP.  Patient discussed with Dr.  Saverio Danker

## 2021-12-04 NOTE — Patient Instructions (Addendum)
Dear Mrs. Batten,  Thank you for trusting [place] with your care. We discussed your recent fall as well as your blood pressure. We recommend having the Occupational Therapists come out to your house to help you some at home. We can help you get this set up if you need it.   We also discussed your blood pressure and heart rate. Your heart rate is low (bradycardia) and your blood pressure is a little high (hypertension), however, we do not think it would be wise to aggressively treat you blood pressure since your heart rate is already low.   While you were in the hospital, your blood work did show a slight increase in your potassium (hyperkalemia), but this improved. We are rechecking it today to ensure that it has stayed within normal limits.   The echocardiogram of your heart looks at how your heart is pumping. It did not show any abnormalities. The CT scan of your head did not show any fractures.   Please return in 1 month for a follow up.   Estimada Sra. Leilani Able por confiar en nosotros con su cuidado. Discutimos su cada reciente, as como su presin arterial. Recomendamos que los terapeutas ocupacionales vengan a su casa para ayudarlo en casa. Podemos ayudarlo a configurar esto si lo necesita.   Tambin discutimos su presin arterial y frecuencia cardaca. Su frecuencia cardaca es baja (bradicardia) y su presin arterial es un poco alta (hipertensin), sin embargo, no creemos que sea prudente tratar agresivamente su presin arterial ya que su frecuencia cardaca ya es baja.   Mientras estuvo en el hospital, su anlisis de sangre mostr un ligero aumento en su potasio (hiperpotasemia), pero esto mejor. Lo estamos volviendo a comprobar hoy para asegurarnos de que se ha mantenido dentro de los [employer].   El ecocardiograma de su corazn observa cmo bombea su corazn. No mostr ninguna anomala. La tomografa computarizada de la cabeza no mostr ninguna fractura.   Por favor,  regrese en 1 mes para un seguimiento.

## 2021-12-05 LAB — BMP8+ANION GAP
Anion Gap: 18 mmol/L (ref 10.0–18.0)
BUN/Creatinine Ratio: 23 (ref 12–28)
BUN: 29 mg/dL — ABNORMAL HIGH (ref 8–27)
CO2: 21 mmol/L (ref 20–29)
Calcium: 9.5 mg/dL (ref 8.7–10.3)
Chloride: 100 mmol/L (ref 96–106)
Creatinine, Ser: 1.26 mg/dL — ABNORMAL HIGH (ref 0.57–1.00)
Glucose: 102 mg/dL — ABNORMAL HIGH (ref 70–99)
Potassium: 5 mmol/L (ref 3.5–5.2)
Sodium: 139 mmol/L (ref 134–144)
eGFR: 42 mL/min/{1.73_m2} — ABNORMAL LOW (ref >59)

## 2021-12-06 ENCOUNTER — Encounter: Payer: Self-pay | Admitting: Internal Medicine

## 2021-12-06 NOTE — Assessment & Plan Note (Signed)
Hospital follow up for mechanical fall. Does complain of msk CP that was present during hospitalization sustained while trying to catch herself during fall. Is improving. Tylenol seems to help. HH OT was recommended. Daughter has been working to get this set up and states she does not require any help to do so. Daughter is able to care for pt majority of day and feels as though she is managing it well.   There is resolving ecchymosis along right side of face with some residual tenderness.   Will provide assistance in setting up [facility] OT as necessary.

## 2021-12-06 NOTE — Assessment & Plan Note (Signed)
Patient has been taking enalapril for many years. BP elevated at OV, but patient did not take medication prior to visit. Asymptomatic at rest. Does report some symptoms of orthostatic hypotension when going from sitting to standing. Daughter is working on encouraging more PO intake.  She has known asymptomatic bradycardia.   Patient likely does not require additional medication at this time. Would caution against over treatment, given her history of known bradycardia. Will plan to have patient take BP med day of follow up appointment and recheck BP at that time.  Patient was also noted to have hyperkalemia during her hospitalization. This has resolved on repeat BMP.

## 2021-12-08 NOTE — Progress Notes (Signed)
Internal Medicine Clinic Attending  Case discussed with Dr. Elliot Gurney  At the time of the visit.  We reviewed the resident's history and exam and pertinent patient test results.  I agree with the assessment, diagnosis, and plan of care documented in the resident's note. Orthostatics negative during hospitalization off ACEi. Will follow-up when patient has taken her blood pressure medications and evaluate need for medication adjustment.

## 2022-01-01 ENCOUNTER — Encounter: Payer: Self-pay | Admitting: Internal Medicine
